# Patient Record
Sex: Female | Born: 1950
Health system: Southern US, Community
[De-identification: ages and names within clinical notes are randomized; demographics above are authoritative.]

## PROBLEM LIST (undated history)

## (undated) DIAGNOSIS — F329 Major depressive disorder, single episode, unspecified: Secondary | ICD-10-CM

## (undated) DIAGNOSIS — K219 Gastro-esophageal reflux disease without esophagitis: Secondary | ICD-10-CM

## (undated) DIAGNOSIS — J45909 Unspecified asthma, uncomplicated: Secondary | ICD-10-CM

## (undated) DIAGNOSIS — F32A Depression, unspecified: Secondary | ICD-10-CM

## (undated) DIAGNOSIS — T7840XA Allergy, unspecified, initial encounter: Secondary | ICD-10-CM

## (undated) HISTORY — DX: Gastro-esophageal reflux disease without esophagitis: K21.9

## (undated) HISTORY — DX: Major depressive disorder, single episode, unspecified: F32.9

## (undated) HISTORY — DX: Depression, unspecified: F32.A

## (undated) HISTORY — DX: Allergy, unspecified, initial encounter: T78.40XA

## (undated) HISTORY — DX: Unspecified asthma, uncomplicated: J45.909

---

## 2002-01-11 HISTORY — PX: NASAL SINUS SURGERY: SHX719

## 2008-08-01 DIAGNOSIS — J309 Allergic rhinitis, unspecified: Secondary | ICD-10-CM | POA: Insufficient documentation

## 2008-08-01 DIAGNOSIS — L219 Seborrheic dermatitis, unspecified: Secondary | ICD-10-CM | POA: Insufficient documentation

## 2009-10-20 ENCOUNTER — Ambulatory Visit: Payer: Self-pay | Admitting: Internal Medicine

## 2010-02-10 NOTE — Assessment & Plan Note (Signed)
Summary: FLU SHOT/JBB  Nurse Visit   Immunizations Administered:  Influenza Vaccine:    Vaccine Type: FLULAVAL    Site: right deltoid    Mfr: GlaxoSmithKline    Dose: 0.5 ml    Route: IM    Given by: Levonne Spiller EMT-P    Exp. Date: 06/11/2010    Lot #: [id number]    VIS given: 08/05/09 version given October 20, 2009.  Orders Added: 1)  Flulaval Injection 3 yrs >IM [Q2036]   Immunizations Administered:  Influenza Vaccine:    Vaccine Type: FLULAVAL    Site: right deltoid    Mfr: GlaxoSmithKline    Dose: 0.5 ml    Route: IM    Given by: Levonne Spiller EMT-P    Exp. Date: 06/11/2010    Lot #: [id number]    VIS given: 08/05/09 version given October 20, 2009.  Flu Vaccine Consent Questions:    Do you have a history of severe allergic reactions to this vaccine? no    Any prior history of allergic reactions to egg and/or gelatin? no    Do you have a sensitivity to the preservative Thimersol? no    Do you have a past history of Guillan-Barre Syndrome? no    Do you currently have an acute febrile illness? no    Have you ever had a severe reaction to latex? no    Vaccine information given and explained to patient? yes    Are you currently pregnant? no

## 2011-06-15 ENCOUNTER — Other Ambulatory Visit (HOSPITAL_COMMUNITY): Payer: Self-pay | Admitting: Family Medicine

## 2011-06-15 DIAGNOSIS — Z1231 Encounter for screening mammogram for malignant neoplasm of breast: Secondary | ICD-10-CM

## 2011-07-08 ENCOUNTER — Ambulatory Visit (HOSPITAL_COMMUNITY)
Admission: RE | Admit: 2011-07-08 | Discharge: 2011-07-08 | Disposition: A | Discharge status: Home / Self Care | Payer: Self-pay | Source: Ambulatory Visit | Attending: Family Medicine | Admitting: Family Medicine

## 2011-07-08 DIAGNOSIS — Z1231 Encounter for screening mammogram for malignant neoplasm of breast: Secondary | ICD-10-CM

## 2012-06-06 DIAGNOSIS — L570 Actinic keratosis: Secondary | ICD-10-CM

## 2012-06-06 HISTORY — DX: Actinic keratosis: L57.0

## 2012-11-06 LAB — HEPATIC FUNCTION PANEL
ALT: 17 U/L (ref 7–35)
AST: 17 U/L (ref 13–35)

## 2012-11-06 LAB — BASIC METABOLIC PANEL
BUN: 10 mg/dL (ref 4–21)
Creatinine: 0.8 mg/dL (ref 0.5–1.1)
GLUCOSE: 94 mg/dL
Potassium: 4.7 mmol/L (ref 3.4–5.3)
Sodium: 144 mmol/L (ref 137–147)

## 2012-11-06 LAB — CBC AND DIFFERENTIAL
HEMATOCRIT: 41 % (ref 36–46)
HEMOGLOBIN: 14 g/dL (ref 12.0–16.0)
PLATELETS: 180 10*3/uL (ref 150–399)
WBC: 4.1 10*3/mL

## 2012-11-06 LAB — LIPID PANEL
CHOLESTEROL: 225 mg/dL — AB (ref 0–200)
HDL: 62 mg/dL (ref 35–70)
LDL CALC: 132 mg/dL
Triglycerides: 157 mg/dL (ref 40–160)

## 2012-11-06 LAB — TSH: TSH: 0.95 u[IU]/mL (ref 0.41–5.90)

## 2012-11-07 ENCOUNTER — Ambulatory Visit: Payer: Self-pay | Admitting: Family Medicine

## 2012-11-08 ENCOUNTER — Ambulatory Visit: Payer: Self-pay | Admitting: Family Medicine

## 2012-12-13 ENCOUNTER — Ambulatory Visit: Payer: Self-pay | Admitting: Family Medicine

## 2013-06-07 LAB — HM COLONOSCOPY

## 2014-02-26 IMAGING — CR DG CHEST 2V
1 series · 2 of 2 positions shown · non-contrast
Comparison: None

CLINICAL DATA: Productive cough for 1 week, coughing up white mucus

EXAM:
CHEST  2 VIEW

[Series 1: pa · 0.17mm/px · 2 of 2 slices shown]
[im 1/2]
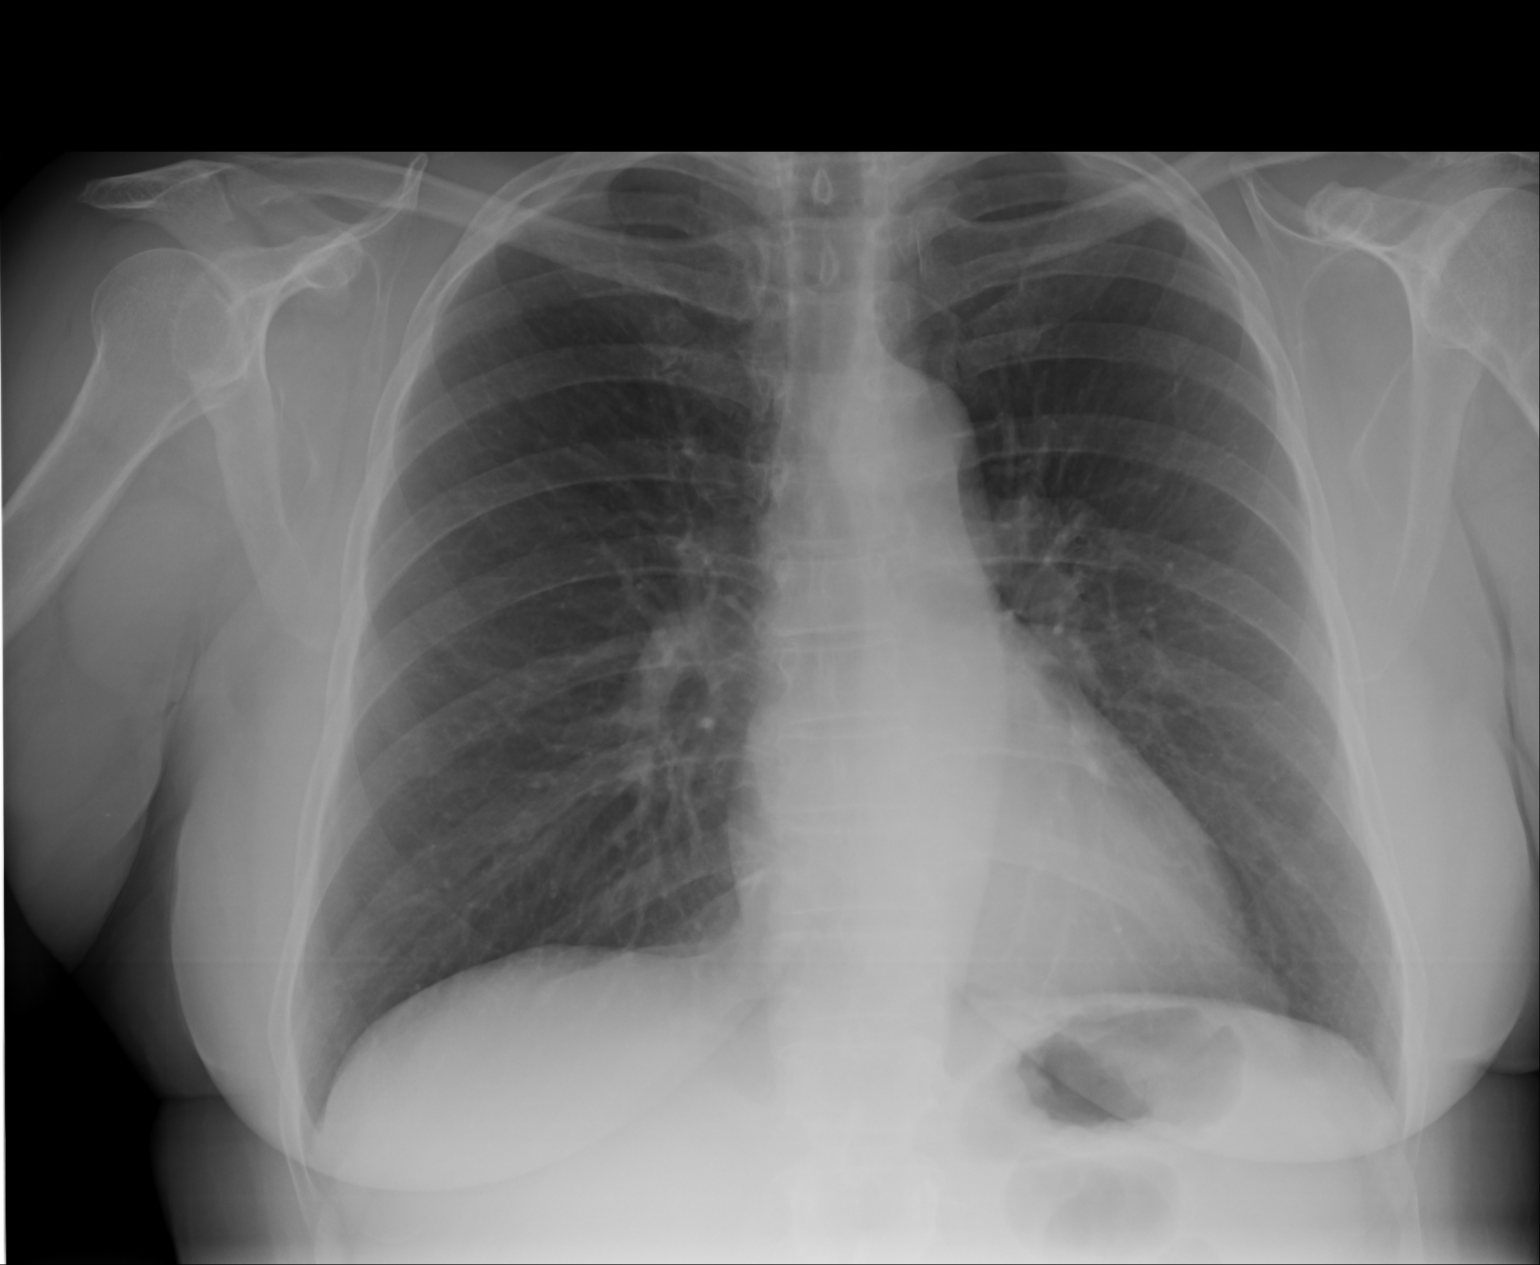
[im 2/2]
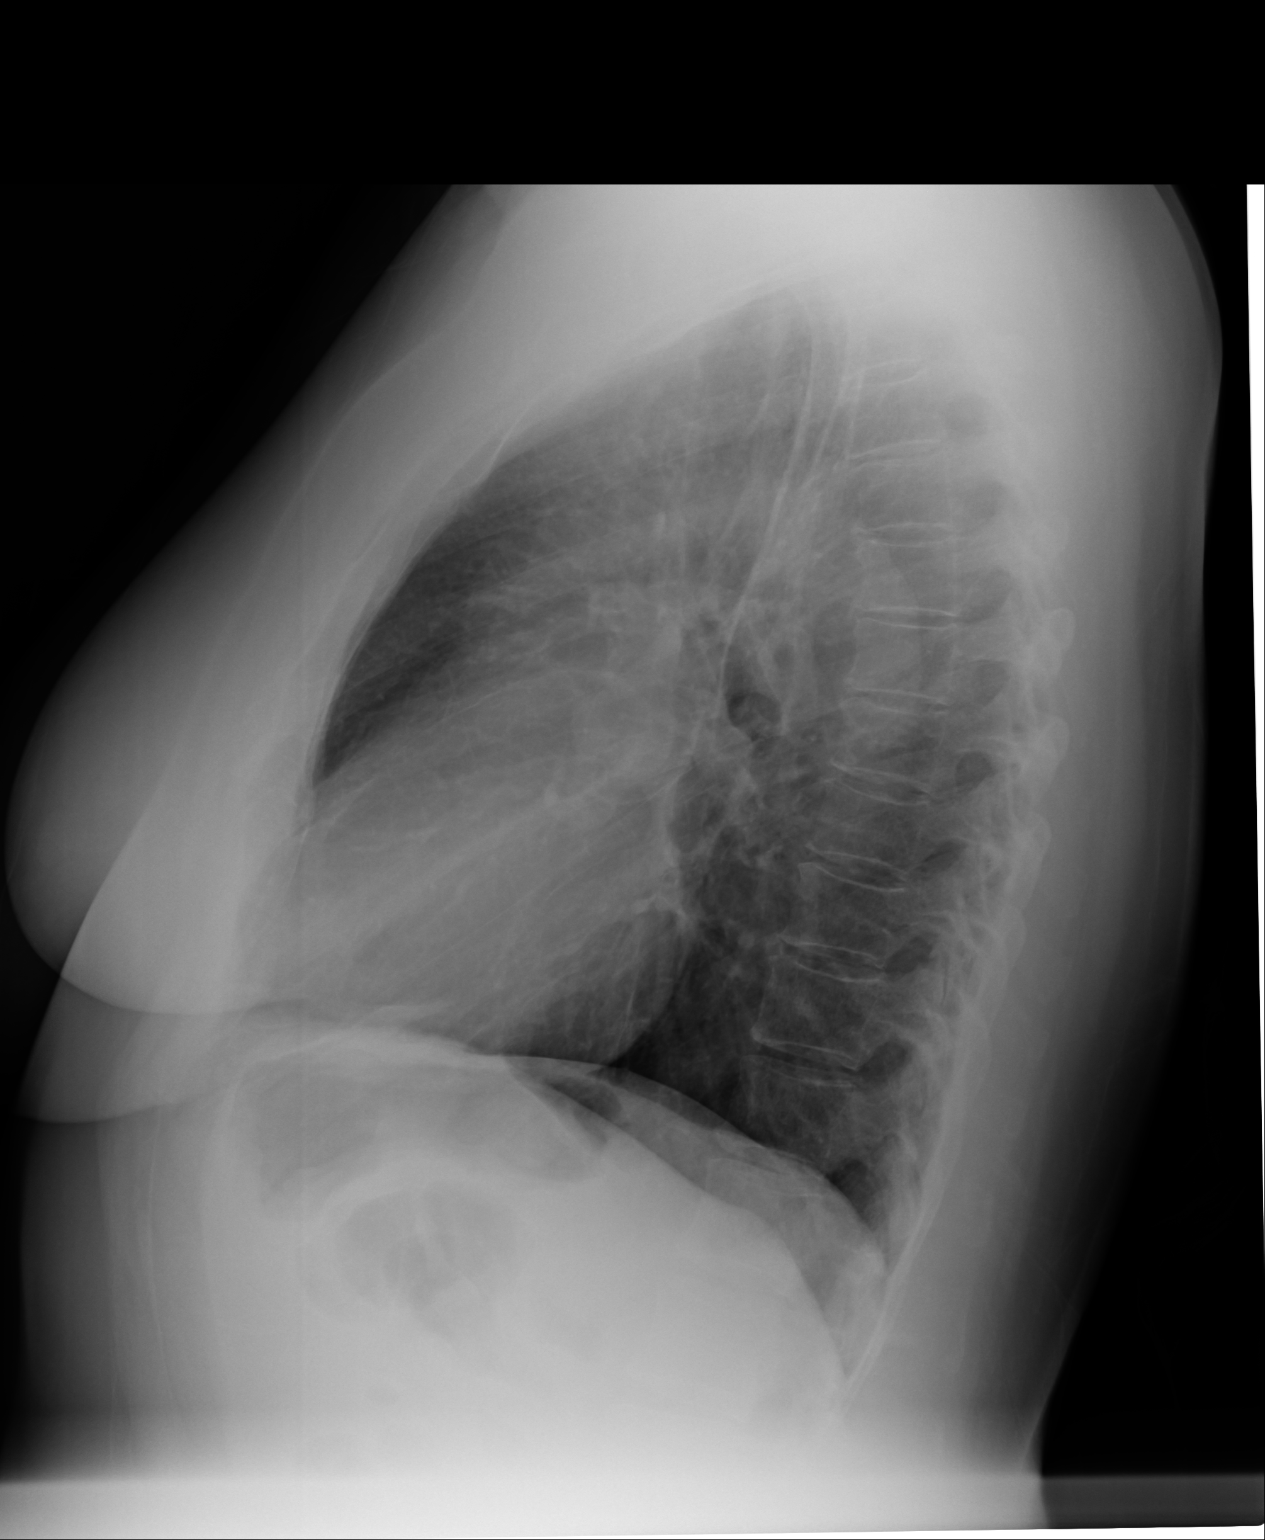

[2 of 2 positions shown; findings below may reference images not displayed]

FINDINGS: Upper normal heart size.

Normal mediastinal contours and pulmonary vascularity.

Lungs clear.

No pleural effusion or pneumothorax.

Bones demineralized.
IMPRESSION: No acute abnormalities.

## 2014-08-22 ENCOUNTER — Other Ambulatory Visit: Payer: Self-pay | Admitting: Family Medicine

## 2014-08-22 ENCOUNTER — Ambulatory Visit
Admission: RE | Admit: 2014-08-22 | Discharge: 2014-08-22 | Disposition: A | Discharge status: Home / Self Care | Payer: BLUE CROSS/BLUE SHIELD | Source: Ambulatory Visit | Attending: Family Medicine | Admitting: Family Medicine

## 2014-08-22 DIAGNOSIS — Z1231 Encounter for screening mammogram for malignant neoplasm of breast: Secondary | ICD-10-CM

## 2014-10-10 ENCOUNTER — Ambulatory Visit (INDEPENDENT_AMBULATORY_CARE_PROVIDER_SITE_OTHER): Payer: BLUE CROSS/BLUE SHIELD | Admitting: Family Medicine

## 2014-10-10 DIAGNOSIS — Z23 Encounter for immunization: Secondary | ICD-10-CM | POA: Diagnosis not present

## 2014-10-10 NOTE — Progress Notes (Signed)
Patient came in office today accompanied with her husband to receive her annual flu shot.Patient reports she has no questions or concerns today. Immunization screening was filled out and vaccine was administered. Patient inquired about shingles vaccine, I gave patient information in regards to vaccine and encouraged her to contact her insurance company to see if they will cover for vaccine.

## 2014-10-29 DIAGNOSIS — E559 Vitamin D deficiency, unspecified: Secondary | ICD-10-CM | POA: Insufficient documentation

## 2014-10-29 DIAGNOSIS — IMO0002 Reserved for concepts with insufficient information to code with codable children: Secondary | ICD-10-CM | POA: Insufficient documentation

## 2014-10-29 DIAGNOSIS — J45901 Unspecified asthma with (acute) exacerbation: Secondary | ICD-10-CM | POA: Insufficient documentation

## 2014-10-29 DIAGNOSIS — J392 Other diseases of pharynx: Secondary | ICD-10-CM | POA: Insufficient documentation

## 2014-10-29 DIAGNOSIS — F329 Major depressive disorder, single episode, unspecified: Secondary | ICD-10-CM | POA: Insufficient documentation

## 2014-10-29 DIAGNOSIS — F32A Depression, unspecified: Secondary | ICD-10-CM | POA: Insufficient documentation

## 2014-10-29 DIAGNOSIS — D219 Benign neoplasm of connective and other soft tissue, unspecified: Secondary | ICD-10-CM | POA: Insufficient documentation

## 2014-10-31 ENCOUNTER — Ambulatory Visit (INDEPENDENT_AMBULATORY_CARE_PROVIDER_SITE_OTHER): Payer: BLUE CROSS/BLUE SHIELD | Admitting: Physician Assistant

## 2014-10-31 ENCOUNTER — Encounter: Payer: Self-pay | Admitting: Physician Assistant

## 2014-10-31 VITALS — BP 130/88 | HR 78 | Temp 98.3°F | Resp 16 | Ht 63.0 in | Wt 165.8 lb

## 2014-10-31 DIAGNOSIS — R42 Dizziness and giddiness: Secondary | ICD-10-CM

## 2014-10-31 DIAGNOSIS — Z91048 Other nonmedicinal substance allergy status: Secondary | ICD-10-CM | POA: Diagnosis not present

## 2014-10-31 DIAGNOSIS — Z9109 Other allergy status, other than to drugs and biological substances: Secondary | ICD-10-CM

## 2014-10-31 DIAGNOSIS — Z124 Encounter for screening for malignant neoplasm of cervix: Secondary | ICD-10-CM | POA: Diagnosis not present

## 2014-10-31 DIAGNOSIS — Z0000 Encounter for general adult medical examination without abnormal findings: Secondary | ICD-10-CM

## 2014-10-31 MED ORDER — MONTELUKAST SODIUM 10 MG PO TABS: 10.0000 mg | ORAL_TABLET | Freq: Every day | ORAL | Status: DC

## 2014-10-31 MED ORDER — MECLIZINE HCL 25 MG PO TABS: 25.0000 mg | ORAL_TABLET | Freq: Three times a day (TID) | ORAL | Status: DC | PRN

## 2014-10-31 NOTE — Patient Instructions (Signed)

## 2014-10-31 NOTE — Progress Notes (Signed)
Patient: Katie Bass, Female    DOB: 10-Mar-1950, 64 y.o.   MRN: [id number] Visit Date: 10/31/2014  Today's Provider: Mar Daring, PA-C   Chief Complaint  Patient presents with  . Annual Exam  . Medication Refill   Subjective:    Annual physical exam Katie Bass is a 64 y.o. female who presents today for health maintenance and complete physical. She feels fairly well. She reports not exercising. She reports she is sleeping fairly well. Patient has history of vertigo. Patient takes meclizine needs a refill if there is no other medicine besides this one. Per patient Meclizine makes very tire and sleepy. Patient states the Allegra-D makes her jittery and can't go to sleep. There is no personal or family history of cervical or ovarian cancer. There is no family history of breast cancer. She states she did have a mammogram this year in September and it was normal. She also had her colonoscopy in 2015 with Dr. Vira Agar.  She is to repeat in 5 years. There is no family history of colon cancer.    Last PCP: 10/26/2012 Mammogram: 11/08/12 BI-RADS category 1: Negative Pap smear: 10/31/12 Normal. HPV Negative Colonoscopy: 06/07/2013 Dr. Tiffany Kocher, Internal Hemorrhoids. Two diminutive polyps at the recto-sigmoid colon. Diverticulosis in the sigmoid colon, in the descending colon, in the transverse colon and in the ascending colon   Review of Systems  Constitutional: Negative.   HENT: Positive for congestion, ear pain and sinus pressure.   Eyes: Negative.   Respiratory: Negative.   Cardiovascular: Negative.   Gastrointestinal: Negative.   Endocrine: Negative.   Genitourinary: Negative.   Musculoskeletal: Negative.   Skin: Negative.   Allergic/Immunologic: Negative.   Neurological: Positive for dizziness.  Hematological: Negative.   Psychiatric/Behavioral: Negative.     Social History She  reports that she has quit smoking. She does not have any smokeless tobacco history on  file. She reports that she drinks alcohol. She reports that she does not use illicit drugs. Social History   Social History  . Marital Status: Married    Spouse Name: N/A  . Number of Children: N/A  . Years of Education: N/A   Social History Main Topics  . Smoking status: Former Research scientist (life sciences)  . Smokeless tobacco: None     Comment: smoked <1pack if cigarettesper day, for 1 year in her 16s  . Alcohol Use: Yes     Comment: 1 cocktail a month  . Drug Use: No  . Sexual Activity: Not Asked   Other Topics Concern  . None   Social History Narrative    Patient Active Problem List   Diagnosis Date Noted  . Extrinsic asthma with exacerbation 10/29/2014  . Clinical depression 10/29/2014  . Disease of pharynx 10/29/2014  . Positional vertigo 10/29/2014  . Fibroid 10/29/2014  . Avitaminosis D 10/29/2014  . Allergic rhinitis 08/01/2008  . Dermatitis seborrheica 08/01/2008    Past Surgical History  Procedure Laterality Date  . Nasal sinus surgery  2004    Family History  Family Status  Relation Status Death Age  . Mother Deceased     cause of death was ovarian cancer and uterine cancer  . Father Deceased     cause of death was stroke  . Brother Alive   . Daughter Alive   . Son Alive    Her family history includes Cancer in her mother.    Allergies  Allergen Reactions  . Sulfa Antibiotics Itching and Swelling  Previous Medications   ERGOCALCIFEROL (VITAMIN D2) 2000 UNITS TABS    Take 1 tablet by mouth daily.   FEXOFENADINE-PSEUDOEPHEDRINE (ALLEGRA-D 24 HOUR PO)    Take 1 tablet by mouth daily.   FLUTICASONE (FLONASE) 50 MCG/ACT NASAL SPRAY    Place into the nose.   MECLIZINE (ANTIVERT) 25 MG TABLET    Take by mouth.   MONTELUKAST (SINGULAIR) 10 MG TABLET    Take by mouth.   MULTIPLE VITAMIN TABLET       OMEGA-3 FATTY ACIDS (FISH OIL) 1000 MG CAPS    Take by mouth.    Patient Care Team: Mar Daring, PA-C as PCP - General (Physician Assistant)     Objective:     Vitals: BP 130/88 mmHg  Pulse 78  Temp(Src) 98.3 F (36.8 C) (Oral)  Resp 16  Ht 5\' 3"  (1.6 m)  Wt 165 lb 12.8 oz (75.206 kg)  BMI 29.38 kg/m2   Physical Exam  Constitutional: She is oriented to person, place, and time. She appears well-developed and well-nourished. No distress.  HENT:  Head: Normocephalic and atraumatic.  Right Ear: Hearing, external ear and ear canal normal. A middle ear effusion is present.  Left Ear: Hearing, external ear and ear canal normal. A middle ear effusion is present.  Nose: Right sinus exhibits maxillary sinus tenderness and frontal sinus tenderness. Left sinus exhibits maxillary sinus tenderness and frontal sinus tenderness.  Mouth/Throat: Uvula is midline, oropharynx is clear and moist and mucous membranes are normal. She has dentures. No oropharyngeal exudate.  Eyes: Conjunctivae and EOM are normal. Pupils are equal, round, and reactive to light. Right eye exhibits no discharge. Left eye exhibits no discharge. No scleral icterus.  Neck: Normal range of motion. Neck supple. No JVD present. Carotid bruit is not present. No tracheal deviation present. No thyromegaly present.  Cardiovascular: Normal rate, regular rhythm, normal heart sounds and intact distal pulses.  Exam reveals no gallop and no friction rub.   No murmur heard. Pulmonary/Chest: Effort normal and breath sounds normal. No respiratory distress. She has no wheezes. She has no rales. She exhibits no tenderness. Right breast exhibits no inverted nipple, no mass, no nipple discharge, no skin change and no tenderness. Left breast exhibits no inverted nipple, no mass, no nipple discharge, no skin change and no tenderness. Breasts are symmetrical.  Abdominal: Soft. Bowel sounds are normal. She exhibits no distension and no mass. There is no tenderness. There is no rebound and no guarding. Hernia confirmed negative in the right inguinal area and confirmed negative in the left inguinal area.   Genitourinary: Vagina normal and uterus normal. Rectal exam shows internal hemorrhoid. Rectal exam shows no external hemorrhoid, no fissure, no mass, no tenderness and anal tone normal. Guaiac negative stool. No breast swelling, tenderness, discharge or bleeding. Pelvic exam was performed with patient supine. There is no rash, tenderness, lesion or injury on the right labia. There is no rash, tenderness, lesion or injury on the left labia. Cervix exhibits discharge (clear mucous-like discharge). Cervix exhibits no motion tenderness and no friability. Right adnexum displays no mass, no tenderness and no fullness. Left adnexum displays no mass, no tenderness and no fullness. No erythema, tenderness or bleeding in the vagina. No signs of injury around the vagina. No vaginal discharge found.  Musculoskeletal: Normal range of motion. She exhibits no edema or tenderness.  Lymphadenopathy:    She has no cervical adenopathy.       Right: No inguinal adenopathy present.  Left: No inguinal adenopathy present.  Neurological: She is alert and oriented to person, place, and time. She has normal reflexes. No cranial nerve deficit. Coordination normal.  Skin: Skin is warm and dry. No rash noted. She is not diaphoretic.  Psychiatric: She has a normal mood and affect. Her behavior is normal. Judgment and thought content normal.  Vitals reviewed.    Depression Screen No flowsheet data found.    Assessment & Plan:     Routine Health Maintenance and Physical Exam  1. Annual physical exam She did not want labs done. Physical exam was overall normal. I will see her back in one year for repeat physical exam or any time sooner if she has any acute issue.  2. Vertigo She does have vertigo for which she is able to control with meclizine. Meclizine was refilled as below. She does state that her vertigo is worse when her allergies are bothering her or if she feels congested. She may call the office if her  vertigo worsens or does not respond to meclizine.  - meclizine (ANTIVERT) 25 MG tablet; Take 1 tablet (25 mg total) by mouth 3 (three) times daily as needed for dizziness.  Dispense: 30 tablet; Refill: 11  3. Environmental allergies She is currently taking Allegra-D over-the-counter and using her Singulair and Flonase for allergy relief. The Allegra-D 24 hour did make her feel little jittery. She stated that she had taken a 12 hour before of the same medication and done better. She is planning to buy the 12 hour and try that again. Singulair was refilled as below. She is to call the office if symptoms fail to improve or worsen.  - montelukast (SINGULAIR) 10 MG tablet; Take 1 tablet (10 mg total) by mouth at bedtime.  Dispense: 30 tablet; Refill: 11  4. Cervical cancer screening  Pelvic exam was normal today. Pap was collected. I will follow-up with her pending Pap results. If Pap is normal I will see her back in one year with repeat physical exam.  - Pap IG w/ reflex to HPV when ASC-U   Exercise Activities and Dietary recommendations Goals    None      Immunization History  Administered Date(s) Administered  . Influenza Whole 10/20/2009  . Influenza,inj,Quad PF,36+ Mos 10/10/2014    Health Maintenance  Topic Date Due  . Hepatitis C Screening  Mar 23, 1950  . HIV Screening  09/14/1966  . TETANUS/TDAP  09/14/1970  . PAP SMEAR  09/13/1972  . ZOSTAVAX  09/14/2011  . INFLUENZA VACCINE  08/12/2015  . MAMMOGRAM  08/21/2016  . COLONOSCOPY  06/08/2023      Discussed health benefits of physical activity, and encouraged her to engage in regular exercise appropriate for her age and condition.    --------------------------------------------------------------------

## 2014-11-01 ENCOUNTER — Other Ambulatory Visit: Payer: Self-pay

## 2014-11-01 ENCOUNTER — Encounter: Payer: Self-pay | Admitting: Physician Assistant

## 2014-11-01 DIAGNOSIS — Z9109 Other allergy status, other than to drugs and biological substances: Secondary | ICD-10-CM

## 2014-11-01 MED ORDER — FLUTICASONE PROPIONATE 50 MCG/ACT NA SUSP: 2.0000 | Freq: Every day | NASAL | Status: DC

## 2014-11-01 NOTE — Telephone Encounter (Signed)
Patient wants this to the [employer].  Thanks.

## 2014-11-06 ENCOUNTER — Telehealth: Payer: Self-pay

## 2014-11-06 LAB — PAP IG W/ RFLX HPV ASCU: PAP SMEAR COMMENT: 0

## 2014-11-06 NOTE — Telephone Encounter (Signed)
-----   Message from Mar Daring, [place] sent at 11/06/2014 12:01 PM EDT ----- Normal pap.

## 2014-11-06 NOTE — Telephone Encounter (Signed)
Patient has been advised. KW 

## 2015-03-21 ENCOUNTER — Ambulatory Visit (INDEPENDENT_AMBULATORY_CARE_PROVIDER_SITE_OTHER): Payer: BLUE CROSS/BLUE SHIELD | Admitting: Physician Assistant

## 2015-03-21 ENCOUNTER — Encounter: Payer: Self-pay | Admitting: Physician Assistant

## 2015-03-21 VITALS — BP 140/88 | HR 74 | Temp 98.1°F | Resp 16 | Wt 168.2 lb

## 2015-03-21 DIAGNOSIS — J0141 Acute recurrent pansinusitis: Secondary | ICD-10-CM

## 2015-03-21 MED ORDER — AMOXICILLIN-POT CLAVULANATE 875-125 MG PO TABS: 1.0000 | ORAL_TABLET | Freq: Two times a day (BID) | ORAL | Status: DC

## 2015-03-21 NOTE — Progress Notes (Signed)
Patient: Katie Bass Female    DOB: 04-07-1950   65 y.o.   MRN: [id number] Visit Date: 03/21/2015  Today's Provider: Mar Daring, PA-C   Chief Complaint  Patient presents with  . Sinusitis   Subjective:    Sinusitis This is a new problem. The problem has been gradually worsening since onset. There has been no fever. Associated symptoms include congestion, coughing, ear pain (Left ear pain started yesterday), headaches, a hoarse voice, sinus pressure, sneezing and a sore throat (started since this week and she is clearing her throat a lot). Pertinent negatives include no chills, shortness of breath or swollen glands. Past treatments include oral decongestants and spray decongestants (Mucinex D, Mucinex 12 hr, Allegra, Flonase). The treatment provided no relief.  She does have chronic sinusitis and states she gets infections each season change. She has been seen by ENT and has had a sinuplasty done in 2006.     Allergies  Allergen Reactions  . Sulfa Antibiotics Itching and Swelling   Previous Medications   ERGOCALCIFEROL (VITAMIN D2) 2000 UNITS TABS    Take 1 tablet by mouth daily.   FEXOFENADINE-PSEUDOEPHEDRINE (ALLEGRA-D 24 HOUR PO)    Take 1 tablet by mouth daily.   FLUTICASONE (FLONASE) 50 MCG/ACT NASAL SPRAY    Place 2 sprays into both nostrils daily.   MECLIZINE (ANTIVERT) 25 MG TABLET    Take 1 tablet (25 mg total) by mouth 3 (three) times daily as needed for dizziness.   MONTELUKAST (SINGULAIR) 10 MG TABLET    Take 1 tablet (10 mg total) by mouth at bedtime.   MULTIPLE VITAMIN TABLET       OMEGA-3 FATTY ACIDS (FISH OIL) 1000 MG CAPS    Take by mouth.    Review of Systems  Constitutional: Negative for fever and chills.  HENT: Positive for congestion, ear pain (Left ear pain started yesterday), hoarse voice, postnasal drip, sinus pressure, sneezing, sore throat (started since this week and she is clearing her throat a lot) and voice change. Negative for  rhinorrhea.   Respiratory: Positive for cough. Negative for chest tightness, shortness of breath and wheezing.   Cardiovascular: Negative for chest pain, palpitations and leg swelling.  Gastrointestinal: Negative for vomiting, abdominal pain and diarrhea.  Neurological: Positive for headaches. Negative for dizziness.    Social History  Substance Use Topics  . Smoking status: Former Research scientist (life sciences)  . Smokeless tobacco: Not on file     Comment: smoked <1pack if cigarettesper day, for 1 year in her 66s  . Alcohol Use: Yes     Comment: 1 cocktail a month   Objective:   BP 140/88 mmHg  Pulse 74  Temp(Src) 98.1 F (36.7 C) (Oral)  Resp 16  Wt 168 lb 3.2 oz (76.295 kg)  SpO2 97%  Physical Exam  Constitutional: She appears well-developed and well-nourished. No distress.  HENT:  Head: Normocephalic and atraumatic.  Right Ear: Hearing, external ear and ear canal normal. Tympanic membrane is not erythematous and not bulging. A middle ear effusion is present.  Left Ear: Hearing, external ear and ear canal normal. Tympanic membrane is not erythematous and not bulging. A middle ear effusion is present.  Nose: Mucosal edema and rhinorrhea present. Right sinus exhibits maxillary sinus tenderness and frontal sinus tenderness. Left sinus exhibits maxillary sinus tenderness and frontal sinus tenderness.  Mouth/Throat: Uvula is midline, oropharynx is clear and moist and mucous membranes are normal. No oropharyngeal exudate, posterior oropharyngeal edema or posterior  oropharyngeal erythema.  Neck: Normal range of motion. Neck supple. No tracheal deviation present. No thyromegaly present.  Cardiovascular: Normal rate, regular rhythm and normal heart sounds.  Exam reveals no gallop and no friction rub.   No murmur heard. Pulmonary/Chest: Effort normal and breath sounds normal. No stridor. No respiratory distress. She has no wheezes. She has no rales.  Lymphadenopathy:    She has no cervical adenopathy.  Skin:  She is not diaphoretic.  Vitals reviewed.       Assessment & Plan:     1. Acute recurrent pansinusitis Worsening symptoms that have not responded to over-the-counter medications. I will give Augmentin as below. She is to continue her Allegra and Flonase for seasonal allergy symptoms. She may continue Mucinex DM for congestion. She may take Tylenol as needed for fevers and body aches. She needs to stay well-hydrated and get plenty of rest. She is to call the office if symptoms fail to improve or worsen. - amoxicillin-clavulanate (AUGMENTIN) 875-125 MG tablet; Take 1 tablet by mouth 2 (two) times daily.  Dispense: 20 tablet; Refill: 0       Mar Daring, PA-C  [facility]

## 2015-03-21 NOTE — Patient Instructions (Signed)

## 2015-05-23 ENCOUNTER — Encounter: Payer: Self-pay | Admitting: Physician Assistant

## 2015-05-23 ENCOUNTER — Telehealth: Payer: Self-pay | Admitting: *Deleted

## 2015-05-23 ENCOUNTER — Ambulatory Visit (INDEPENDENT_AMBULATORY_CARE_PROVIDER_SITE_OTHER): Payer: BLUE CROSS/BLUE SHIELD | Admitting: Physician Assistant

## 2015-05-23 VITALS — BP 160/90 | HR 78 | Temp 98.2°F | Resp 16 | Wt 165.6 lb

## 2015-05-23 DIAGNOSIS — W57XXXA Bitten or stung by nonvenomous insect and other nonvenomous arthropods, initial encounter: Secondary | ICD-10-CM | POA: Diagnosis not present

## 2015-05-23 DIAGNOSIS — T148 Other injury of unspecified body region: Secondary | ICD-10-CM | POA: Diagnosis not present

## 2015-05-23 MED ORDER — DOXYCYCLINE HYCLATE 100 MG PO TABS: 100.0000 mg | ORAL_TABLET | Freq: Two times a day (BID) | ORAL | Status: DC

## 2015-05-23 NOTE — Telephone Encounter (Signed)
Patient called office to schedule an apt. Patient found a tick on her side yesterday morning. Patient removed tick, but she says there is a knot and some redness where tick was stuck. Patient has appt today at 1:45.

## 2015-05-23 NOTE — Progress Notes (Signed)
Patient: Katie Bass Female    DOB: 02-06-1950   65 y.o.   MRN: [id number] Visit Date: 05/23/2015  Today's Provider: Mar Daring, PA-C   Chief Complaint  Patient presents with  . Rash   Subjective:    HPI  Katie Bass is here today c/o a rash. She pulled a tick off yesterday morning from the upper left side. She was itching and she felt like a little bump and when she pulled it off notice it was a tick. She notice the rash yesterday after she pulled it off. She being putting alcohol and neosporin on the area. She denies any fevers, chills, nausea, vomiting, rash in other areas, or myalgias.     Allergies  Allergen Reactions  . Sulfa Antibiotics Itching and Swelling   Previous Medications   AMOXICILLIN-CLAVULANATE (AUGMENTIN) 875-125 MG TABLET    Take 1 tablet by mouth 2 (two) times daily.   ERGOCALCIFEROL (VITAMIN D2) 2000 UNITS TABS    Take 1 tablet by mouth daily.   FEXOFENADINE-PSEUDOEPHEDRINE (ALLEGRA-D 24 HOUR PO)    Take 1 tablet by mouth daily.   FLUTICASONE (FLONASE) 50 MCG/ACT NASAL SPRAY    Place 2 sprays into both nostrils daily.   MECLIZINE (ANTIVERT) 25 MG TABLET    Take 1 tablet (25 mg total) by mouth 3 (three) times daily as needed for dizziness.   MONTELUKAST (SINGULAIR) 10 MG TABLET    Take 1 tablet (10 mg total) by mouth at bedtime.   MULTIPLE VITAMIN TABLET       OMEGA-3 FATTY ACIDS (FISH OIL) 1000 MG CAPS    Take by mouth.    Review of Systems  Constitutional: Negative for fever and chills.  HENT: Negative for congestion, postnasal drip, rhinorrhea, sore throat, tinnitus and trouble swallowing.   Respiratory: Negative for cough, chest tightness and shortness of breath.   Cardiovascular: Negative for chest pain.  Gastrointestinal: Negative for nausea, vomiting and abdominal pain.  Skin: Positive for rash.    Social History  Substance Use Topics  . Smoking status: Former Research scientist (life sciences)  . Smokeless tobacco: Not on file     Comment: smoked  <1pack if cigarettesper day, for 1 year in her 48s  . Alcohol Use: Yes     Comment: 1 cocktail a month   Objective:   BP 160/90 mmHg  Pulse 78  Temp(Src) 98.2 F (36.8 C) (Oral)  Resp 16  Wt 165 lb 9.6 oz (75.116 kg)  Physical Exam  Constitutional: She appears well-developed and well-nourished. No distress.  Neck: Normal range of motion. Neck supple.  Cardiovascular: Normal rate, regular rhythm and normal heart sounds.  Exam reveals no gallop and no friction rub.   No murmur heard. Pulmonary/Chest: Effort normal and breath sounds normal. No respiratory distress. She has no wheezes. She has no rales.  Lymphadenopathy:    She has no cervical adenopathy.  Skin: She is not diaphoretic.     Vitals reviewed.       Assessment & Plan:     1. Tick bite Due to warmth and induration will treat with doxycycline as below. Advised to avoid direct sun while on medication. Take with food. Call if rash worsens, develops myalgias or if fevers develop. - doxycycline (VIBRA-TABS) 100 MG tablet; Take 1 tablet (100 mg total) by mouth 2 (two) times daily.  Dispense: 14 tablet; Refill: 0       Mar Daring, PA-C  [facility]

## 2015-05-23 NOTE — Patient Instructions (Signed)
Tick Bite Information Ticks are insects that attach themselves to the skin and draw blood for food. There are various types of ticks. Common types include wood ticks and deer ticks. Most ticks live in shrubs and grassy areas. Ticks can climb onto your body when you make contact with leaves or grass where the tick is waiting. The most common places on the body for ticks to attach themselves are the scalp, neck, armpits, waist, and groin. Most tick bites are harmless, but sometimes ticks carry germs that cause diseases. These germs can be spread to a person during the tick's feeding process. The chance of a disease spreading through a tick bite depends on:   The type of tick.  Time of year.   How long the tick is attached.   Geographic location.  HOW CAN YOU PREVENT TICK BITES? Take these steps to help prevent tick bites when you are outdoors:  Wear protective clothing. Long sleeves and long pants are best.   Wear white clothes so you can see ticks more easily.  Tuck your pant legs into your socks.   If walking on a trail, stay in the middle of the trail to avoid brushing against bushes.  Avoid walking through areas with long grass.  Put insect repellent on all exposed skin and along boot tops, pant legs, and sleeve cuffs.   Check clothing, hair, and skin repeatedly and before going inside.   Brush off any ticks that are not attached.  Take a shower or bath as soon as possible after being outdoors.  WHAT IS THE PROPER WAY TO REMOVE A TICK? Ticks should be removed as soon as possible to help prevent diseases caused by tick bites. 1. If latex gloves are available, put them on before trying to remove a tick.  2. Using fine-point tweezers, grasp the tick as close to the skin as possible. You may also use curved forceps or a tick removal tool. Grasp the tick as close to its head as possible. Avoid grasping the tick on its body. 3. Pull gently with steady upward pressure until  the tick lets go. Do not twist the tick or jerk it suddenly. This may break off the tick's head or mouth parts. 4. Do not squeeze or crush the tick's body. This could force disease-carrying fluids from the tick into your body.  5. After the tick is removed, wash the bite area and your hands with soap and water or other disinfectant such as alcohol. 6. Apply a small amount of antiseptic cream or ointment to the bite site.  7. Wash and disinfect any instruments that were used.  Do not try to remove a tick by applying a hot match, petroleum jelly, or fingernail polish to the tick. These methods do not work and may increase the chances of disease being spread from the tick bite.  WHEN SHOULD YOU SEEK MEDICAL CARE? Contact your health care provider if you are unable to remove a tick from your skin or if a part of the tick breaks off and is stuck in the skin.  After a tick bite, you need to be aware of signs and symptoms that could be related to diseases spread by ticks. Contact your health care provider if you develop any of the following in the days or weeks after the tick bite:  Unexplained fever.  Rash. A circular rash that appears days or weeks after the tick bite may indicate the possibility of Lyme disease. The rash may resemble   a target with a bull's-eye and may occur at a different part of your body than the tick bite.  Redness and swelling in the area of the tick bite.   Tender, swollen lymph glands.   Diarrhea.   Weight loss.   Cough.   Fatigue.   Muscle, joint, or bone pain.   Abdominal pain.   Headache.   Lethargy or a change in your level of consciousness.  Difficulty walking or moving your legs.   Numbness in the legs.   Paralysis.  Shortness of breath.   Confusion.   Repeated vomiting.    This information is not intended to replace advice given to you by your health care provider. Make sure you discuss any questions you have with your health  care provider.   Document Released: 12/26/1999 Document Revised: 01/18/2014 Document Reviewed: 06/07/2012 Elsevier Interactive Patient Education 2016 [employer]  

## 2015-05-28 ENCOUNTER — Encounter: Payer: Self-pay | Admitting: Physician Assistant

## 2015-05-28 ENCOUNTER — Ambulatory Visit (INDEPENDENT_AMBULATORY_CARE_PROVIDER_SITE_OTHER): Payer: BLUE CROSS/BLUE SHIELD | Admitting: Physician Assistant

## 2015-05-28 VITALS — BP 140/90 | HR 69 | Temp 98.2°F | Resp 16

## 2015-05-28 DIAGNOSIS — T148 Other injury of unspecified body region: Secondary | ICD-10-CM

## 2015-05-28 DIAGNOSIS — L989 Disorder of the skin and subcutaneous tissue, unspecified: Secondary | ICD-10-CM | POA: Diagnosis not present

## 2015-05-28 DIAGNOSIS — W57XXXA Bitten or stung by nonvenomous insect and other nonvenomous arthropods, initial encounter: Secondary | ICD-10-CM | POA: Diagnosis not present

## 2015-05-28 DIAGNOSIS — R238 Other skin changes: Secondary | ICD-10-CM

## 2015-05-28 MED ORDER — NYSTATIN-TRIAMCINOLONE 100000-0.1 UNIT/GM-% EX OINT: 1.0000 "application " | TOPICAL_OINTMENT | Freq: Two times a day (BID) | CUTANEOUS | Status: DC

## 2015-05-28 NOTE — Progress Notes (Signed)
Patient: Katie Bass Female    DOB: [birth year]   65 y.o.   MRN: [id number] Visit Date: 05/28/2015  Today's Provider: Mar Daring, PA-C   Chief Complaint  Patient presents with  . Itching from the tick bite   Subjective:    HPI  Katie Bass returns today with c/o of still itching on the tick bite that she had last week. It is more red and very itchy. She reports she can't wear a bra because it bothers her. More bumps around the tick bite. She is currently on doxycycline and has 2 days left of treatment.      Allergies  Allergen Reactions  . Sulfa Antibiotics Itching and Swelling   Previous Medications   AMOXICILLIN-CLAVULANATE (AUGMENTIN) 875-125 MG TABLET    Take 1 tablet by mouth 2 (two) times daily.   DOXYCYCLINE (VIBRA-TABS) 100 MG TABLET    Take 1 tablet (100 mg total) by mouth 2 (two) times daily.   ERGOCALCIFEROL (VITAMIN D2) 2000 UNITS TABS    Take 1 tablet by mouth daily.   FEXOFENADINE-PSEUDOEPHEDRINE (ALLEGRA-D 24 HOUR PO)    Take 1 tablet by mouth daily.   FLUTICASONE (FLONASE) 50 MCG/ACT NASAL SPRAY    Place 2 sprays into both nostrils daily.   MECLIZINE (ANTIVERT) 25 MG TABLET    Take 1 tablet (25 mg total) by mouth 3 (three) times daily as needed for dizziness.   MONTELUKAST (SINGULAIR) 10 MG TABLET    Take 1 tablet (10 mg total) by mouth at bedtime.   MULTIPLE VITAMIN TABLET       OMEGA-3 FATTY ACIDS (FISH OIL) 1000 MG CAPS    Take by mouth.    Review of Systems  Constitutional: Negative for fever, chills and fatigue.  Respiratory: Negative for cough, chest tightness and shortness of breath.   Cardiovascular: Negative for chest pain and leg swelling.  Gastrointestinal: Negative for nausea, vomiting and abdominal pain.  Skin: Positive for rash.  Neurological: Negative for dizziness, weakness and headaches.    Social History  Substance Use Topics  . Smoking status: Former Research scientist (life sciences)  . Smokeless tobacco: Not on file     Comment: smoked <1pack  if cigarettesper day, for 1 year in her 7s  . Alcohol Use: Yes     Comment: 1 cocktail a month   Objective:   BP 140/90 mmHg  Pulse 69  Temp(Src) 98.2 F (36.8 C) (Oral)  Resp 16  Wt   Physical Exam  Constitutional: She appears well-developed and well-nourished. No distress.  Neck: Normal range of motion. Neck supple.  Cardiovascular: Normal rate, regular rhythm and normal heart sounds.  Exam reveals no gallop and no friction rub.   No murmur heard. Pulmonary/Chest: Effort normal and breath sounds normal. No respiratory distress. She has no wheezes. She has no rales.  Lymphadenopathy:    She has no cervical adenopathy.  Skin: Rash noted. Rash is maculopapular. She is not diaphoretic.     Vitals reviewed.       Assessment & Plan:     1. Skin irritation The rash that is now present looks like an allergic reaction vs irritation due to either the tick bite itself or the cleansers she has been using over the area. Advised to discontinue the rubbing alcohol and cream she is using and to begin using the myocolog II cream as below. She is to call the office if the rash continues to spread. - nystatin-triamcinolone ointment (MYCOLOG); Apply 1  application topically 2 (two) times daily. X 10-14 days  Dispense: 30 g; Refill: 0  2. Tick bite See above medical treatment plan - nystatin-triamcinolone ointment (MYCOLOG); Apply 1 application topically 2 (two) times daily. X 10-14 days  Dispense: 30 g; Refill: 0       Mar Daring, PA-C  [facility]

## 2015-05-28 NOTE — Patient Instructions (Signed)
Tick Bite Information Ticks are insects that attach themselves to the skin and draw blood for food. There are various types of ticks. Common types include wood ticks and deer ticks. Most ticks live in shrubs and grassy areas. Ticks can climb onto your body when you make contact with leaves or grass where the tick is waiting. The most common places on the body for ticks to attach themselves are the scalp, neck, armpits, waist, and groin. Most tick bites are harmless, but sometimes ticks carry germs that cause diseases. These germs can be spread to a person during the tick's feeding process. The chance of a disease spreading through a tick bite depends on:   The type of tick.  Time of year.   How long the tick is attached.   Geographic location.  HOW CAN YOU PREVENT TICK BITES? Take these steps to help prevent tick bites when you are outdoors:  Wear protective clothing. Long sleeves and long pants are best.   Wear white clothes so you can see ticks more easily.  Tuck your pant legs into your socks.   If walking on a trail, stay in the middle of the trail to avoid brushing against bushes.  Avoid walking through areas with long grass.  Put insect repellent on all exposed skin and along boot tops, pant legs, and sleeve cuffs.   Check clothing, hair, and skin repeatedly and before going inside.   Brush off any ticks that are not attached.  Take a shower or bath as soon as possible after being outdoors.  WHAT IS THE PROPER WAY TO REMOVE A TICK? Ticks should be removed as soon as possible to help prevent diseases caused by tick bites. 1. If latex gloves are available, put them on before trying to remove a tick.  2. Using fine-point tweezers, grasp the tick as close to the skin as possible. You may also use curved forceps or a tick removal tool. Grasp the tick as close to its head as possible. Avoid grasping the tick on its body. 3. Pull gently with steady upward pressure until  the tick lets go. Do not twist the tick or jerk it suddenly. This may break off the tick's head or mouth parts. 4. Do not squeeze or crush the tick's body. This could force disease-carrying fluids from the tick into your body.  5. After the tick is removed, wash the bite area and your hands with soap and water or other disinfectant such as alcohol. 6. Apply a small amount of antiseptic cream or ointment to the bite site.  7. Wash and disinfect any instruments that were used.  Do not try to remove a tick by applying a hot match, petroleum jelly, or fingernail polish to the tick. These methods do not work and may increase the chances of disease being spread from the tick bite.  WHEN SHOULD YOU SEEK MEDICAL CARE? Contact your health care provider if you are unable to remove a tick from your skin or if a part of the tick breaks off and is stuck in the skin.  After a tick bite, you need to be aware of signs and symptoms that could be related to diseases spread by ticks. Contact your health care provider if you develop any of the following in the days or weeks after the tick bite:  Unexplained fever.  Rash. A circular rash that appears days or weeks after the tick bite may indicate the possibility of Lyme disease. The rash may resemble   a target with a bull's-eye and may occur at a different part of your body than the tick bite.  Redness and swelling in the area of the tick bite.   Tender, swollen lymph glands.   Diarrhea.   Weight loss.   Cough.   Fatigue.   Muscle, joint, or bone pain.   Abdominal pain.   Headache.   Lethargy or a change in your level of consciousness.  Difficulty walking or moving your legs.   Numbness in the legs.   Paralysis.  Shortness of breath.   Confusion.   Repeated vomiting.    This information is not intended to replace advice given to you by your health care provider. Make sure you discuss any questions you have with your health  care provider.   Document Released: 12/26/1999 Document Revised: 01/18/2014 Document Reviewed: 06/07/2012 Elsevier Interactive Patient Education 2016 [employer]  

## 2015-06-10 ENCOUNTER — Encounter: Payer: Self-pay | Admitting: Family Medicine

## 2015-06-10 ENCOUNTER — Ambulatory Visit (INDEPENDENT_AMBULATORY_CARE_PROVIDER_SITE_OTHER): Payer: BLUE CROSS/BLUE SHIELD | Admitting: Family Medicine

## 2015-06-10 VITALS — BP 136/90 | HR 80 | Temp 98.2°F | Resp 16 | Wt 167.8 lb

## 2015-06-10 DIAGNOSIS — W57XXXA Bitten or stung by nonvenomous insect and other nonvenomous arthropods, initial encounter: Secondary | ICD-10-CM | POA: Diagnosis not present

## 2015-06-10 DIAGNOSIS — T148 Other injury of unspecified body region: Secondary | ICD-10-CM | POA: Diagnosis not present

## 2015-06-10 DIAGNOSIS — L304 Erythema intertrigo: Secondary | ICD-10-CM

## 2015-06-10 MED ORDER — DOXYCYCLINE HYCLATE 100 MG PO TABS: 100.0000 mg | ORAL_TABLET | Freq: Two times a day (BID) | ORAL | Status: DC

## 2015-06-10 MED ORDER — FLUOCINONIDE 0.05 % EX CREA: 1.0000 "application " | TOPICAL_CREAM | Freq: Three times a day (TID) | CUTANEOUS | Status: DC

## 2015-06-10 NOTE — Patient Instructions (Signed)
Discussed use of clotrimazole cream mixed in equal amounts with the steroid cream 3 x day.

## 2015-06-10 NOTE — Progress Notes (Signed)
Subjective:     Patient ID: Katie Bass, female   DOB: Jul 14, 1950, 65 y.o.   MRN: [id number]  HPI  Chief Complaint  Patient presents with  . Rash    Patient comes in office today to address rash on her left side of breast Patient reports that she was seen in office about 3 weeks ago for tick bite and was precsribed round of doxycyline. Patient states that after she finished antibiotic rash never went away around tick bite and skin was red and irritated. Patient reports that rash is spreading up torso and she has beenusing Nystatin cream thaqt was prescribed at last visit but it is not helping with rash.   States she d/c'ed the Nystatin/triamcinolone after 3 days as it did not seem like it helped. Reports rash seemed to improve the most after treatment with the doxycycline. Has resumed cleaning with alcohol, soap and Neosporin abx. Accompanied by her husband today.   Review of Systems     Objective:   Physical Exam  Constitutional: She appears well-developed and well-nourished. No distress.  Skin:  Right anterio-lateral breast with linear area of non-confluent rash with a few 3-4 mm.papules/no vesicles or drainage.       Assessment:    1. Tick bite - doxycycline (VIBRA-TABS) 100 MG tablet; Take 1 tablet (100 mg total) by mouth 2 (two) times daily.  Dispense: 14 tablet; Refill: 0  2. Intertrigo: ? Fungal ? Contact reaction  - fluocinonide cream (LIDEX) 0.05 %; Apply 1 application topically 3 (three) times daily. To rash under your breast  Dispense: 15 g; Refill: 0        Discussed using clotrimazole cream with the steroid cream 3 x day.

## 2015-09-25 ENCOUNTER — Telehealth: Payer: Self-pay | Admitting: Physician Assistant

## 2015-09-25 NOTE — Telephone Encounter (Signed)
Pt's husband Jeanell Sparrow is requesting that pt be seen today for swelling in the right side of her neck under her ear. Ray stated that he thinks it might be fluid. Can pt be worked in today? Please advise. Thanks TNP

## 2015-09-25 NOTE — Telephone Encounter (Signed)
Please review-aa 

## 2015-09-25 NOTE — Telephone Encounter (Signed)
I have no openings today unfortunately since I worked in that other patient. If she can wait until tomorrow you can try to find a place to work her in tomorrow. If not she should go to Urgent Care.

## 2015-09-25 NOTE — Telephone Encounter (Signed)
Pt advised and will wait and see you tomorrow, appt made-aa

## 2015-09-26 ENCOUNTER — Encounter: Payer: Self-pay | Admitting: Physician Assistant

## 2015-09-26 ENCOUNTER — Ambulatory Visit (INDEPENDENT_AMBULATORY_CARE_PROVIDER_SITE_OTHER): Payer: BLUE CROSS/BLUE SHIELD | Admitting: Physician Assistant

## 2015-09-26 VITALS — BP 136/84 | HR 84 | Temp 99.3°F | Resp 16 | Wt 166.0 lb

## 2015-09-26 DIAGNOSIS — I889 Nonspecific lymphadenitis, unspecified: Secondary | ICD-10-CM | POA: Diagnosis not present

## 2015-09-26 DIAGNOSIS — R05 Cough: Secondary | ICD-10-CM

## 2015-09-26 DIAGNOSIS — J0140 Acute pansinusitis, unspecified: Secondary | ICD-10-CM

## 2015-09-26 DIAGNOSIS — R059 Cough, unspecified: Secondary | ICD-10-CM

## 2015-09-26 MED ORDER — HYDROCODONE-HOMATROPINE 5-1.5 MG/5ML PO SYRP
5.0000 mL | ORAL_SOLUTION | Freq: Three times a day (TID) | ORAL | 0 refills | Status: DC | PRN
Start: 2015-09-26 — End: 2016-01-22

## 2015-09-26 MED ORDER — AMOXICILLIN-POT CLAVULANATE 875-125 MG PO TABS: 1.0000 | ORAL_TABLET | Freq: Two times a day (BID) | ORAL | 0 refills | Status: DC

## 2015-09-26 NOTE — Patient Instructions (Addendum)
Sinusitis, Adult Sinusitis is redness, soreness, and inflammation of the paranasal sinuses. Paranasal sinuses are air pockets within the bones of your face. They are located beneath your eyes, in the middle of your forehead, and above your eyes. In healthy paranasal sinuses, mucus is able to drain out, and air is able to circulate through them by way of your nose. However, when your paranasal sinuses are inflamed, mucus and air can become trapped. This can allow bacteria and other germs to grow and cause infection. Sinusitis can develop quickly and last only a short time (acute) or continue over a long period (chronic). Sinusitis that lasts for more than 12 weeks is considered chronic. CAUSES Causes of sinusitis include:  Allergies.  Structural abnormalities, such as displacement of the cartilage that separates your nostrils (deviated septum), which can decrease the air flow through your nose and sinuses and affect sinus drainage.  Functional abnormalities, such as when the small hairs (cilia) that line your sinuses and help remove mucus do not work properly or are not present. SIGNS AND SYMPTOMS Symptoms of acute and chronic sinusitis are the same. The primary symptoms are pain and pressure around the affected sinuses. Other symptoms include:  Upper toothache.  Earache.  Headache.  Bad breath.  Decreased sense of smell and taste.  A cough, which worsens when you are lying flat.  Fatigue.  Fever.  Thick drainage from your nose, which often is green and may contain pus (purulent).  Swelling and warmth over the affected sinuses. DIAGNOSIS Your health care provider will perform a physical exam. During your exam, your health care provider may perform any of the following to help determine if you have acute sinusitis or chronic sinusitis:  Look in your nose for signs of abnormal growths in your nostrils (nasal polyps).  Tap over the affected sinus to check for signs of  infection.  View the inside of your sinuses using an imaging device that has a light attached (endoscope). If your health care provider suspects that you have chronic sinusitis, one or more of the following tests may be recommended:  Allergy tests.  Nasal culture. A sample of mucus is taken from your nose, sent to a lab, and screened for bacteria.  Nasal cytology. A sample of mucus is taken from your nose and examined by your health care provider to determine if your sinusitis is related to an allergy. TREATMENT Most cases of acute sinusitis are related to a viral infection and will resolve on their own within 10 days. Sometimes, medicines are prescribed to help relieve symptoms of both acute and chronic sinusitis. These may include pain medicines, decongestants, nasal steroid sprays, or saline sprays. However, for sinusitis related to a bacterial infection, your health care provider will prescribe antibiotic medicines. These are medicines that will help kill the bacteria causing the infection. Rarely, sinusitis is caused by a fungal infection. In these cases, your health care provider will prescribe antifungal medicine. For some cases of chronic sinusitis, surgery is needed. Generally, these are cases in which sinusitis recurs more than 3 times per year, despite other treatments. HOME CARE INSTRUCTIONS  Drink plenty of water. Water helps thin the mucus so your sinuses can drain more easily.  Use a humidifier.  Inhale steam 3-4 times a day (for example, sit in the bathroom with the shower running).  Apply a warm, moist washcloth to your face 3-4 times a day, or as directed by your health care provider.  Use saline nasal sprays to help   moisten and clean your sinuses.  Take medicines only as directed by your health care provider.  If you were prescribed either an antibiotic or antifungal medicine, finish it all even if you start to feel better. SEEK IMMEDIATE MEDICAL CARE IF:  You have  increasing pain or severe headaches.  You have nausea, vomiting, or drowsiness.  You have swelling around your face.  You have vision problems.  You have a stiff neck.  You have difficulty breathing.   This information is not intended to replace advice given to you by your health care provider. Make sure you discuss any questions you have with your health care provider.   Document Released: 12/28/2004 Document Revised: 01/18/2014 Document Reviewed: 01/12/2011 Elsevier Interactive Patient Education 2016 Else[employer] Lymphadenopathy Lymphadenopathy refers to swollen or enlarged lymph glands, also called lymph nodes. Lymph glands are part of your body's defense (immune) system, which protects the body from infections, germs, and diseases. Lymph glands are found in many locations in your body, including the neck, underarm, and groin.  Many things can cause lymph glands to become enlarged. When your immune system responds to germs, such as viruses or bacteria, infection-fighting cells and fluid build up. This causes the glands to grow in size. Usually, this is not something to worry about. The swelling and any soreness often go away without treatment. However, swollen lymph glands can also be caused by a number of diseases. Your health care provider may do various tests to help determine the cause. If the cause of your swollen lymph glands cannot be found, it is important to monitor your condition to make sure the swelling goes away. HOME CARE INSTRUCTIONS Watch your condition for any changes. The following actions may help to lessen any discomfort you are feeling:  Get plenty of rest.  Take medicines only as directed by your health care provider. Your health care provider may recommend over-the-counter medicines for pain.  Apply moist heat compresses to the site of swollen lymph nodes as directed by your health care provider. This can help reduce any pain.  Check your lymph nodes daily for  any changes.  Keep all follow-up visits as directed by your health care provider. This is important. SEEK MEDICAL CARE IF:  Your lymph nodes are still swollen after 2 weeks.  Your swelling increases or spreads to other areas.  Your lymph nodes are hard, seem fixed to the skin, or are growing rapidly.  Your skin over the lymph nodes is red and inflamed.  You have a fever.  You have chills.  You have fatigue.  You develop a sore throat.  You have abdominal pain.  You have weight loss.  You have night sweats. SEEK IMMEDIATE MEDICAL CARE IF:  You notice fluid leaking from the area of the enlarged lymph node.  You have severe pain in any area of your body.  You have chest pain.  You have shortness of breath.   This information is not intended to replace advice given to you by your health care provider. Make sure you discuss any questions you have with your health care provider.   Document Released: 10/07/2007 Document Revised: 01/18/2014 Document Reviewed: 08/02/2013 Elsevier Interactive Patient Education [employer].

## 2015-09-26 NOTE — Progress Notes (Signed)
Patient: Katie Bass Female    DOB: 1950/01/24   65 y.o.   MRN: [id number] Visit Date: 09/26/2015  Today's Provider: Mar Daring, PA-C   Chief Complaint  Patient presents with  . Neck Pain  . URI   Subjective:    HPI Patient reports that she has had swelling in her neck in the last 2 days. Patient reports that it comes and goes. She reports that she has had this before in the past, but it went away on its own. Patient also mentions that it was tender when she touches it. She also describes it as being a "pillow" around her neck. She reports that it was difficult for her to move her head to the right due to the tenderness.  Patient also reports that she has had a URI that has been going on for about 1 month. Patient's symptoms include sinus congestion, headache, and dizziness. Patient is also requesting another refill for meclizine.     Allergies  Allergen Reactions  . Sulfa Antibiotics Itching and Swelling     Current Outpatient Prescriptions:  .  Ergocalciferol (VITAMIN D2) 2000 UNITS TABS, Take 1 tablet by mouth daily., Disp: , Rfl:  .  Fexofenadine-Pseudoephedrine (ALLEGRA-D 24 HOUR PO), Take 1 tablet by mouth daily., Disp: , Rfl:  .  fluticasone (FLONASE) 50 MCG/ACT nasal spray, Place 2 sprays into both nostrils daily., Disp: 16 g, Rfl: 11 .  meclizine (ANTIVERT) 25 MG tablet, Take 1 tablet (25 mg total) by mouth 3 (three) times daily as needed for dizziness., Disp: 30 tablet, Rfl: 11 .  montelukast (SINGULAIR) 10 MG tablet, Take 1 tablet (10 mg total) by mouth at bedtime., Disp: 30 tablet, Rfl: 11 .  Multiple Vitamin tablet, , Disp: , Rfl:  .  Omega-3 Fatty Acids (FISH OIL) 1000 MG CAPS, Take by mouth., Disp: , Rfl:   Review of Systems  Constitutional: Positive for fatigue.  HENT: Positive for congestion, facial swelling, postnasal drip, rhinorrhea, sinus pressure and sore throat. Negative for ear discharge, ear pain, sneezing and trouble swallowing.   Eyes:  Negative for visual disturbance.  Respiratory: Positive for cough. Negative for chest tightness, shortness of breath and wheezing.   Cardiovascular: Negative for chest pain, palpitations and leg swelling.  Gastrointestinal: Negative for abdominal pain and nausea.  Musculoskeletal: Positive for myalgias, neck pain and neck stiffness.  Neurological: Positive for dizziness and headaches. Negative for weakness.    Social History  Substance Use Topics  . Smoking status: Former Research scientist (life sciences)  . Smokeless tobacco: Not on file     Comment: smoked <1pack if cigarettesper day, for 1 year in her 84s  . Alcohol use Yes     Comment: 1 cocktail a month   Objective:   BP 136/84 (BP Location: Right Arm, Patient Position: Sitting, Cuff Size: Normal)   Pulse 84   Temp 99.3 F (37.4 C)   Resp 16   Wt 166 lb (75.3 kg)   BMI 29.41 kg/m   Physical Exam  Constitutional: She appears well-developed and well-nourished. No distress.  HENT:  Head: Normocephalic and atraumatic.  Right Ear: Hearing, tympanic membrane, external ear and ear canal normal.  Left Ear: Hearing, tympanic membrane, external ear and ear canal normal.  Nose: Mucosal edema and rhinorrhea present. Right sinus exhibits maxillary sinus tenderness and frontal sinus tenderness. Left sinus exhibits maxillary sinus tenderness and frontal sinus tenderness.  Mouth/Throat: Uvula is midline and mucous membranes are normal. Posterior oropharyngeal erythema (  cobblestoning) present. No oropharyngeal exudate or posterior oropharyngeal edema.  Neck: Normal range of motion. Neck supple. No tracheal deviation present. No thyromegaly present.  Cardiovascular: Normal rate, regular rhythm and normal heart sounds.  Exam reveals no gallop and no friction rub.   No murmur heard. Pulmonary/Chest: Effort normal and breath sounds normal. No stridor. No respiratory distress. She has no wheezes. She has no rales.  Lymphadenopathy:       Head (right side): No submental,  no submandibular, no tonsillar, no preauricular, no posterior auricular and no occipital adenopathy present.       Head (left side): No submental, no submandibular, no tonsillar, no preauricular, no posterior auricular and no occipital adenopathy present.    She has cervical adenopathy.       Right cervical: Superficial cervical (supraclavicular) adenopathy present. No deep cervical and no posterior cervical adenopathy present.      Left cervical: No superficial cervical, no deep cervical and no posterior cervical adenopathy present.    She has no axillary adenopathy.  Skin: She is not diaphoretic.  Vitals reviewed.      Assessment & Plan:     1. Acute pansinusitis, recurrence not specified Worsening symptoms over the last month that has not responded to over-the-counter treatments. I will give her Augmentin as below. She needs to make sure to stay well-hydrated and try to get plenty of rest. She is to call the office if symptoms fail to improve. - amoxicillin-clavulanate (AUGMENTIN) 875-125 MG tablet; Take 1 tablet by mouth 2 (two) times daily.  Dispense: 20 tablet; Refill: 0  2. Cough Worsening nighttime cough that is affecting sleep. I will give her Hycodan cough syrup as below. She is to call the office if cough does not improve. - HYDROcodone-homatropine (HYCODAN) 5-1.5 MG/5ML syrup; Take 5 mLs by mouth every 8 (eight) hours as needed.  Dispense: 180 mL; Refill: 0  3. Lymphadenitis Supraclavicular lymph node noted that is enlarged and tender to palpation on the right side. Most likely secondary to sinus infection. Will treat with Augmentin. She is to call the office if the lymph node does not decrease in size following treatment. If there is no improvement will consider ultrasound. - amoxicillin-clavulanate (AUGMENTIN) 875-125 MG tablet; Take 1 tablet by mouth 2 (two) times daily.  Dispense: 20 tablet; Refill: 0       Mar Daring, PA-C  [facility]

## 2015-10-22 ENCOUNTER — Ambulatory Visit (INDEPENDENT_AMBULATORY_CARE_PROVIDER_SITE_OTHER): Payer: BLUE CROSS/BLUE SHIELD | Admitting: Physician Assistant

## 2015-10-22 DIAGNOSIS — Z23 Encounter for immunization: Secondary | ICD-10-CM

## 2015-11-04 ENCOUNTER — Ambulatory Visit
Admission: RE | Admit: 2015-11-04 | Discharge: 2015-11-04 | Disposition: A | Discharge status: Home / Self Care | Payer: BLUE CROSS/BLUE SHIELD | Source: Ambulatory Visit | Attending: Physician Assistant | Admitting: Physician Assistant

## 2015-11-04 ENCOUNTER — Other Ambulatory Visit: Payer: Self-pay | Admitting: Physician Assistant

## 2015-11-04 DIAGNOSIS — Z1231 Encounter for screening mammogram for malignant neoplasm of breast: Secondary | ICD-10-CM

## 2015-11-06 ENCOUNTER — Telehealth: Payer: Self-pay

## 2015-11-06 NOTE — Telephone Encounter (Signed)
Patient advised as below.  

## 2015-11-06 NOTE — Telephone Encounter (Signed)
-----   Message from Mar Daring, [place] sent at 11/05/2015  5:41 PM EDT ----- Normal mammogram. Repeat screening in one year.

## 2015-12-29 ENCOUNTER — Encounter: Payer: Self-pay | Admitting: Physician Assistant

## 2015-12-29 ENCOUNTER — Ambulatory Visit (INDEPENDENT_AMBULATORY_CARE_PROVIDER_SITE_OTHER): Payer: BLUE CROSS/BLUE SHIELD | Admitting: Physician Assistant

## 2015-12-29 ENCOUNTER — Encounter: Payer: BLUE CROSS/BLUE SHIELD | Admitting: Physician Assistant

## 2015-12-29 VITALS — BP 140/70 | HR 72 | Temp 98.3°F | Resp 16 | Ht 63.0 in | Wt 166.2 lb

## 2015-12-29 DIAGNOSIS — Z0000 Encounter for general adult medical examination without abnormal findings: Secondary | ICD-10-CM | POA: Diagnosis not present

## 2015-12-29 DIAGNOSIS — Z13220 Encounter for screening for lipoid disorders: Secondary | ICD-10-CM

## 2015-12-29 DIAGNOSIS — Z131 Encounter for screening for diabetes mellitus: Secondary | ICD-10-CM

## 2015-12-29 DIAGNOSIS — Z136 Encounter for screening for cardiovascular disorders: Secondary | ICD-10-CM | POA: Diagnosis not present

## 2015-12-29 DIAGNOSIS — Z9109 Other allergy status, other than to drugs and biological substances: Secondary | ICD-10-CM | POA: Diagnosis not present

## 2015-12-29 DIAGNOSIS — R42 Dizziness and giddiness: Secondary | ICD-10-CM | POA: Diagnosis not present

## 2015-12-29 DIAGNOSIS — Z1159 Encounter for screening for other viral diseases: Secondary | ICD-10-CM | POA: Diagnosis not present

## 2015-12-29 MED ORDER — MONTELUKAST SODIUM 10 MG PO TABS: 10.0000 mg | ORAL_TABLET | Freq: Every day | ORAL | 11 refills | Status: DC

## 2015-12-29 MED ORDER — MECLIZINE HCL 25 MG PO TABS: 25.0000 mg | ORAL_TABLET | Freq: Three times a day (TID) | ORAL | 11 refills | Status: DC | PRN

## 2015-12-29 NOTE — Patient Instructions (Signed)

## 2015-12-29 NOTE — Progress Notes (Signed)
Patient: Katie Bass, Female    DOB: 1951/01/04, 65 y.o.   MRN: [id number] Visit Date: 12/29/2015  Today's Provider: Mar Daring, PA-C   Chief Complaint  Patient presents with  . Annual Exam   Subjective:    Annual physical exam Katie Bass is a 65 y.o. female who presents today for health maintenance and complete physical. She feels well. She reports exercising none. She reports she is sleeping fairly well. Patient is requesting medication refills on Meclizine she uses this as needed for her dizziness and Montelukast for allergies.  Last PCP:10/31/14 Mammogram:11/04/15 BI-RADS 1 Pap: 11/01/14 Normal -----------------------------------------------------------------   Review of Systems  Constitutional: Negative.   HENT: Negative.   Eyes: Negative.   Respiratory: Negative.   Cardiovascular: Negative.   Gastrointestinal: Negative.   Endocrine: Negative.   Genitourinary: Negative.   Musculoskeletal: Negative.   Skin: Negative.   Allergic/Immunologic: Negative.   Neurological: Negative.   Hematological: Negative.   Psychiatric/Behavioral: Negative.     Social History      She  reports that she has quit smoking. She has never used smokeless tobacco. She reports that she does not drink alcohol or use drugs.       Social History   Social History  . Marital status: Married    Spouse name: N/A  . Number of children: N/A  . Years of education: N/A   Social History Main Topics  . Smoking status: Former Research scientist (life sciences)  . Smokeless tobacco: Never Used     Comment: smoked <1pack if cigarettesper day, for 1 year in her 36s  . Alcohol use No  . Drug use: No  . Sexual activity: Not Asked   Other Topics Concern  . None   Social History Narrative  . None    Past Medical History:  Diagnosis Date  . Asthma   . Depression      Patient Active Problem List   Diagnosis Date Noted  . Need for hepatitis C screening test 12/29/2015  . Extrinsic asthma with  exacerbation 10/29/2014  . Clinical depression 10/29/2014  . Disease of pharynx 10/29/2014  . Positional vertigo 10/29/2014  . Fibroid 10/29/2014  . Avitaminosis D 10/29/2014  . Allergic rhinitis 08/01/2008  . Dermatitis seborrheica 08/01/2008    Past Surgical History:  Procedure Laterality Date  . NASAL SINUS SURGERY  2004    Family History        Family Status  Relation Status  . Mother Deceased   cause of death was ovarian cancer and uterine cancer  . Father Deceased   cause of death was stroke  . Brother Alive  . Daughter Alive  . Son Alive  . Sister Alive        Her family history includes Breast cancer (age of onset: 54) in her sister; Cancer in her mother.     Allergies  Allergen Reactions  . Sulfa Antibiotics Itching and Swelling     Current Outpatient Prescriptions:  .  Ergocalciferol (VITAMIN D2) 2000 UNITS TABS, Take 1 tablet by mouth daily., Disp: , Rfl:  .  Fexofenadine-Pseudoephedrine (ALLEGRA-D 24 HOUR PO), Take 1 tablet by mouth daily., Disp: , Rfl:  .  fluticasone (FLONASE) 50 MCG/ACT nasal spray, Place 2 sprays into both nostrils daily., Disp: 16 g, Rfl: 11 .  meclizine (ANTIVERT) 25 MG tablet, Take 1 tablet (25 mg total) by mouth 3 (three) times daily as needed for dizziness., Disp: 30 tablet, Rfl: 11 .  montelukast (SINGULAIR) 10  MG tablet, Take 1 tablet (10 mg total) by mouth at bedtime., Disp: 30 tablet, Rfl: 11 .  Multiple Vitamin tablet, , Disp: , Rfl:  .  Omega-3 Fatty Acids (FISH OIL) 1000 MG CAPS, Take by mouth., Disp: , Rfl:  .  amoxicillin-clavulanate (AUGMENTIN) 875-125 MG tablet, Take 1 tablet by mouth 2 (two) times daily. (Patient not taking: Reported on 12/29/2015), Disp: 20 tablet, Rfl: 0 .  HYDROcodone-homatropine (HYCODAN) 5-1.5 MG/5ML syrup, Take 5 mLs by mouth every 8 (eight) hours as needed. (Patient not taking: Reported on 12/29/2015), Disp: 180 mL, Rfl: 0   Patient Care Team: Mar Daring, PA-C as PCP - General (Physician  Assistant)      Objective:   Vitals: BP 140/70 (BP Location: Right Arm, Patient Position: Sitting, Cuff Size: Normal)   Pulse 72   Temp 98.3 F (36.8 C) (Oral)   Resp 16   Ht 5\' 3"  (1.6 m)   Wt 166 lb 3.2 oz (75.4 kg)   BMI 29.44 kg/m    Physical Exam  Constitutional: She is oriented to person, place, and time. She appears well-developed and well-nourished. No distress.  HENT:  Head: Normocephalic and atraumatic.  Right Ear: Tympanic membrane, external ear and ear canal normal.  Left Ear: Tympanic membrane, external ear and ear canal normal.  Nose: Nose normal.  Mouth/Throat: Uvula is midline, oropharynx is clear and moist and mucous membranes are normal. No oropharyngeal exudate.  Eyes: Conjunctivae and EOM are normal. Pupils are equal, round, and reactive to light. Right eye exhibits no discharge. Left eye exhibits no discharge. No scleral icterus.  Neck: Normal range of motion. Neck supple. No JVD present. Carotid bruit is not present. No tracheal deviation present. No thyromegaly present.  Cardiovascular: Normal rate, regular rhythm, normal heart sounds and intact distal pulses.  Exam reveals no gallop and no friction rub.   No murmur heard. Pulmonary/Chest: Effort normal and breath sounds normal. No respiratory distress. She has no wheezes. She has no rales. She exhibits no tenderness.  Abdominal: Soft. Bowel sounds are normal. She exhibits no distension and no mass. There is no tenderness. There is no rebound and no guarding.  Musculoskeletal: Normal range of motion. She exhibits no edema or tenderness.  Lymphadenopathy:    She has no cervical adenopathy.  Neurological: She is alert and oriented to person, place, and time. She has normal reflexes.  Skin: Skin is warm and dry. No rash noted. She is not diaphoretic.  Psychiatric: She has a normal mood and affect. Her behavior is normal. Judgment and thought content normal.  Vitals reviewed.    Depression Screen No  flowsheet data found.    Assessment & Plan:     Routine Health Maintenance and Physical Exam  Exercise Activities and Dietary recommendations Goals    None      Immunization History  Administered Date(s) Administered  . Influenza Whole 10/20/2009  . Influenza,inj,Quad PF,36+ Mos 10/10/2014, 10/22/2015    Health Maintenance  Topic Date Due  . Hepatitis C Screening  04-10-1950  . HIV Screening  09/14/1966  . TETANUS/TDAP  09/14/1970  . ZOSTAVAX  09/14/2011  . PAP SMEAR  10/31/2017  . MAMMOGRAM  11/03/2017  . COLONOSCOPY  06/08/2018  . INFLUENZA VACCINE  Completed     Discussed health benefits of physical activity, and encouraged her to engage in regular exercise appropriate for her age and condition.    1. Annual physical exam Normal physical exam today. Will check labs as below and  f/u pending lab results. If labs are stable and WNL she will not need to have these rechecked for one year at her next annual physical exam. She is to call the office in the meantime if she has any acute issue, questions or concerns. - CBC with Differential/Platelet - Comprehensive metabolic panel - TSH  2. Need for hepatitis C screening test - Hepatitis C antibody  3. Vertigo Stable. Diagnosis pulled for medication refill. Continue current medical treatment plan. - meclizine (ANTIVERT) 25 MG tablet; Take 1 tablet (25 mg total) by mouth 3 (three) times daily as needed for dizziness.  Dispense: 30 tablet; Refill: 11  4. Environmental allergies Stable. Diagnosis pulled for medication refill. Continue current medical treatment plan. - montelukast (SINGULAIR) 10 MG tablet; Take 1 tablet (10 mg total) by mouth at bedtime.  Dispense: 30 tablet; Refill: 11  5. Diabetes mellitus screening Will check labs as below and f/u pending results. - Hemoglobin A1c  6. Encounter for lipid screening for cardiovascular disease Will check labs as below and f/u pending results. - Lipid  panel  --------------------------------------------------------------------    Mar Daring, PA-C  [facility]

## 2015-12-30 ENCOUNTER — Telehealth: Payer: Self-pay

## 2015-12-30 LAB — COMPREHENSIVE METABOLIC PANEL
A/G RATIO: 2 (ref 1.2–2.2)
ALT: 13 IU/L (ref 0–32)
AST: 17 IU/L (ref 0–40)
Albumin: 4.3 g/dL (ref 3.6–4.8)
Alkaline Phosphatase: 103 IU/L (ref 39–117)
BILIRUBIN TOTAL: 0.2 mg/dL (ref 0.0–1.2)
BUN/Creatinine Ratio: 14 (ref 12–28)
BUN: 11 mg/dL (ref 8–27)
CHLORIDE: 102 mmol/L (ref 96–106)
CO2: 27 mmol/L (ref 18–29)
Calcium: 9.7 mg/dL (ref 8.7–10.3)
Creatinine, Ser: 0.76 mg/dL (ref 0.57–1.00)
GFR calc non Af Amer: 83 mL/min/{1.73_m2} (ref >59)
GFR, EST AFRICAN AMERICAN: 96 mL/min/{1.73_m2} (ref >59)
GLUCOSE: 88 mg/dL (ref 65–99)
Globulin, Total: 2.1 g/dL (ref 1.5–4.5)
POTASSIUM: 4.1 mmol/L (ref 3.5–5.2)
Sodium: 139 mmol/L (ref 134–144)
Total Protein: 6.4 g/dL (ref 6.0–8.5)

## 2015-12-30 LAB — CBC WITH DIFFERENTIAL/PLATELET
BASOS ABS: 0 10*3/uL (ref 0.0–0.2)
Basos: 0 %
EOS (ABSOLUTE): 0.1 10*3/uL (ref 0.0–0.4)
Eos: 2 %
Hematocrit: 41.1 % (ref 34.0–46.6)
Hemoglobin: 13.7 g/dL (ref 11.1–15.9)
IMMATURE GRANS (ABS): 0 10*3/uL (ref 0.0–0.1)
IMMATURE GRANULOCYTES: 0 %
LYMPHS: 42 %
Lymphocytes Absolute: 2.4 10*3/uL (ref 0.7–3.1)
MCH: 28.9 pg (ref 26.6–33.0)
MCHC: 33.3 g/dL (ref 31.5–35.7)
MCV: 87 fL (ref 79–97)
MONOS ABS: 0.4 10*3/uL (ref 0.1–0.9)
Monocytes: 6 %
NEUTROS ABS: 2.8 10*3/uL (ref 1.4–7.0)
NEUTROS PCT: 50 %
PLATELETS: 227 10*3/uL (ref 150–379)
RBC: 4.74 x10E6/uL (ref 3.77–5.28)
RDW: 13.7 % (ref 12.3–15.4)
WBC: 5.7 10*3/uL (ref 3.4–10.8)

## 2015-12-30 LAB — LIPID PANEL
CHOLESTEROL TOTAL: 217 mg/dL — AB (ref 100–199)
Chol/HDL Ratio: 3.5 ratio units (ref 0.0–4.4)
HDL: 62 mg/dL (ref >39)
LDL Calculated: 116 mg/dL — ABNORMAL HIGH (ref 0–99)
Triglycerides: 196 mg/dL — ABNORMAL HIGH (ref 0–149)
VLDL Cholesterol Cal: 39 mg/dL (ref 5–40)

## 2015-12-30 LAB — HEMOGLOBIN A1C
Est. average glucose Bld gHb Est-mCnc: 103 mg/dL
HEMOGLOBIN A1C: 5.2 % (ref 4.8–5.6)

## 2015-12-30 LAB — TSH: TSH: 1.16 u[IU]/mL (ref 0.450–4.500)

## 2015-12-30 LAB — HEPATITIS C ANTIBODY: HEP C VIRUS AB: 0.2 {s_co_ratio} (ref 0.0–0.9)

## 2015-12-30 NOTE — Telephone Encounter (Signed)
Patient advised as below. Patient verbalizes understanding and is in agreement with treatment plan.  

## 2015-12-30 NOTE — Telephone Encounter (Signed)
-----   Message from Mar Daring, PA-C sent at 12/30/2015  8:24 AM EST ----- Cholesterol still borderline high. Continue healthy lifestyle modifications. All other labs are WNL.

## 2015-12-30 NOTE — Telephone Encounter (Signed)
LMTCB

## 2016-01-21 ENCOUNTER — Encounter: Payer: BLUE CROSS/BLUE SHIELD | Admitting: Physician Assistant

## 2016-01-22 ENCOUNTER — Encounter: Payer: Self-pay | Admitting: Family Medicine

## 2016-01-22 ENCOUNTER — Ambulatory Visit (INDEPENDENT_AMBULATORY_CARE_PROVIDER_SITE_OTHER): Payer: BLUE CROSS/BLUE SHIELD | Admitting: Family Medicine

## 2016-01-22 VITALS — BP 142/78 | HR 76 | Temp 98.4°F | Resp 17 | Wt 162.6 lb

## 2016-01-22 DIAGNOSIS — B9789 Other viral agents as the cause of diseases classified elsewhere: Secondary | ICD-10-CM | POA: Diagnosis not present

## 2016-01-22 DIAGNOSIS — J069 Acute upper respiratory infection, unspecified: Secondary | ICD-10-CM | POA: Diagnosis not present

## 2016-01-22 MED ORDER — HYDROCODONE-HOMATROPINE 5-1.5 MG/5ML PO SYRP: ORAL_SOLUTION | ORAL | 0 refills | Status: DC

## 2016-01-22 MED ORDER — AMOXICILLIN-POT CLAVULANATE 875-125 MG PO TABS: 1.0000 | ORAL_TABLET | Freq: Two times a day (BID) | ORAL | 0 refills | Status: DC

## 2016-01-22 NOTE — Progress Notes (Signed)
Subjective:     Patient ID: Katie Bass, female   DOB: 02-Jan-1951, 67 y.o.   MRN: [id number]  HPI  Chief Complaint  Patient presents with  . Cough    Patient comes in office today with concerns of cough and congestion since 01/16/16. Patient reports the followingh symptoms; hoarsness, chest/head congestion, bilateral eye pain, sore throat and rib pain. Patient has taken otc Mucinex and Mucinex Cough and Cold.    Patient reports increased sinus pressure, purulent to clear sinus drainage, post nasal drainage and accompanying cough.   Review of Systems     Objective:   Physical Exam  Constitutional: She appears well-developed and well-nourished. She has a sickly appearance (frequent throat clearing and cough). No distress.  Ears: T.M's intact without inflammation. Left TM dull in appearance Sinuses: mild frontal and maxillary sinus tenderness Throat: no tonsillar enlargement or exudate; mild posterior pharyngeal erythema Neck: no cervical adenopathy Lungs: clear     Assessment:    1. Viral upper respiratory tract infection - amoxicillin-clavulanate (AUGMENTIN) 875-125 MG tablet; Take 1 tablet by mouth 2 (two) times daily.  Dispense: 20 tablet; Refill: 0 - HYDROcodone-homatropine (HYCODAN) 5-1.5 MG/5ML syrup; 5 ml 4-6 hours as needed for cough  Dispense: 240 mL; Refill: 0    Plan:   Start Mucinex D and cough syrup. If sinuses not improving over the next two days to start abx

## 2016-01-22 NOTE — Patient Instructions (Signed)
Discussed use of Mucinex D and cough syrup. If sinuses not improving over the next two days to start the antibiotic.

## 2016-02-05 ENCOUNTER — Encounter: Payer: Self-pay | Admitting: Family Medicine

## 2016-02-05 ENCOUNTER — Ambulatory Visit (INDEPENDENT_AMBULATORY_CARE_PROVIDER_SITE_OTHER): Payer: BLUE CROSS/BLUE SHIELD | Admitting: Family Medicine

## 2016-02-05 VITALS — BP 124/70 | HR 91 | Temp 98.3°F | Resp 17 | Wt 162.4 lb

## 2016-02-05 DIAGNOSIS — J0100 Acute maxillary sinusitis, unspecified: Secondary | ICD-10-CM

## 2016-02-05 MED ORDER — AMOXICILLIN-POT CLAVULANATE 875-125 MG PO TABS: 1.0000 | ORAL_TABLET | Freq: Two times a day (BID) | ORAL | 0 refills | Status: DC

## 2016-02-05 MED ORDER — HYDROCODONE-HOMATROPINE 5-1.5 MG/5ML PO SYRP: ORAL_SOLUTION | ORAL | 0 refills | Status: DC

## 2016-02-05 NOTE — Progress Notes (Signed)
Subjective:     Patient ID: Katie Bass, female   DOB: 02-May-1950, 66 y.o.   MRN: [id number]  HPI  Chief Complaint  Patient presents with  . Cough    Patient comes in office today for follow up URI. Patient reports since last office visit on 01/22/16 cough has been worse. Patient states that cough is painful and productive, patient reports flank pain and shortness of breath associated.   States her sinuses got worse and she initiated the Augmentin. Reports that she felt her sinuses were getting better but once she completed the abx intermittent right sided sinus congestion, PND, and accompanying cough persisted.   Review of Systems     Objective:   Physical Exam  Constitutional: She appears well-developed and well-nourished. No distress.  Ears: T.M's intact without inflammation Sinuses: mild maxillary sinus tenderness Throat: no tonsillar enlargement or exudate Neck: no cervical adenopathy Lungs: clear     Assessment:    1. Acute non-recurrent maxillary sinusitis - amoxicillin-clavulanate (AUGMENTIN) 875-125 MG tablet; Take 1 tablet by mouth 2 (two) times daily.  Dispense: 20 tablet; Refill: 0 - HYDROcodone-homatropine (HYCODAN) 5-1.5 MG/5ML syrup; 5 ml 4-6 hours as needed for cough  Dispense: 240 mL; Refill: 0      Plan:    Phone f/u if not resolved.

## 2016-02-05 NOTE — Patient Instructions (Signed)
Let me know when you finish the antibiotic if not better.

## 2016-03-30 ENCOUNTER — Telehealth: Payer: Self-pay

## 2016-03-30 NOTE — Telephone Encounter (Signed)
Do not use alcohol or peroxide on it as this will only make worse. May use warm compress over the area and soak in epsom salt soaks.

## 2016-03-30 NOTE — Telephone Encounter (Signed)
Patient calling that she was coming in to see [facility] tomorrow but canceled appointment because of the weather. Patient is scheduled to see Tawanna Sat 03/22.  Rash Triaged: Per patient she developed a little lump on the left inner thigh(groin area) 6 days ago and thought it was a hair follicle.She reports it was" bothersome" not painful and she tried poking it with a needle but nothing came out. She reports that right after doing that she put alcohol and has peroxide. She started to have some burning and itching a few days ago and she notice little red bumps in that area. Still no drainage or painful.Just an uncomfortable feeling. She has put Neosporin, baking soda around the area. Advise patient that if Tawanna Sat needed to see her sooner or advise anything else for her to try until she sees her Thursday we will be getting in contact with her.  Thanks,  -Levia Waltermire

## 2016-03-30 NOTE — Telephone Encounter (Signed)
LM as directed below. If patient has any question or concerns to call back.  Thanks,  -Castella Lerner

## 2016-03-31 ENCOUNTER — Ambulatory Visit: Payer: BLUE CROSS/BLUE SHIELD | Admitting: Physician Assistant

## 2016-04-01 ENCOUNTER — Ambulatory Visit (INDEPENDENT_AMBULATORY_CARE_PROVIDER_SITE_OTHER): Payer: BLUE CROSS/BLUE SHIELD | Admitting: Physician Assistant

## 2016-04-01 ENCOUNTER — Encounter: Payer: Self-pay | Admitting: Physician Assistant

## 2016-04-01 VITALS — BP 130/88 | HR 80 | Temp 98.3°F | Resp 16 | Wt 160.0 lb

## 2016-04-01 DIAGNOSIS — Z9109 Other allergy status, other than to drugs and biological substances: Secondary | ICD-10-CM

## 2016-04-01 DIAGNOSIS — L259 Unspecified contact dermatitis, unspecified cause: Secondary | ICD-10-CM | POA: Diagnosis not present

## 2016-04-01 MED ORDER — FLUTICASONE PROPIONATE 50 MCG/ACT NA SUSP: 2.0000 | Freq: Every day | NASAL | 11 refills | Status: DC

## 2016-04-01 MED ORDER — TRIAMCINOLONE ACETONIDE 0.1 % EX CREA: 1.0000 "application " | TOPICAL_CREAM | Freq: Three times a day (TID) | CUTANEOUS | 0 refills | Status: DC

## 2016-04-01 NOTE — Progress Notes (Signed)
Patient: Katie Bass Female    DOB: Feb 04, 1950   66 y.o.   MRN: [id number] Visit Date: 04/01/2016  Today's Provider: Mar Daring, PA-C   Chief Complaint  Patient presents with  . Rash   Subjective:    Rash  This is a new problem. The current episode started 1 to 4 weeks ago (x 8 days). The problem has been gradually improving since onset. The affected locations include the left upper leg. The rash is characterized by itchiness, redness and burning. She was exposed to nothing. Associated symptoms include congestion (nasal) and coughing (dry). Pertinent negatives include no anorexia, diarrhea, eye pain, facial edema, fatigue, fever, joint pain, nail changes, rhinorrhea, shortness of breath, sore throat or vomiting. Treatments tried: pt thought that the area was initially an ingrown hair. Pt tried to remove the ingrown hair with a needle. Pt also tried Nystatin cream. The treatment provided no relief.   Allergic Rhinitis Pt os requesting refill of Flonase.    Allergies  Allergen Reactions  . Sulfa Antibiotics Itching and Swelling     Current Outpatient Prescriptions:  .  Ergocalciferol (VITAMIN D2) 2000 UNITS TABS, Take 1 tablet by mouth daily., Disp: , Rfl:  .  fluticasone (FLONASE) 50 MCG/ACT nasal spray, Place 2 sprays into both nostrils daily., Disp: 16 g, Rfl: 11 .  Multiple Vitamin tablet, , Disp: , Rfl:  .  Omega-3 Fatty Acids (FISH OIL) 1000 MG CAPS, Take by mouth., Disp: , Rfl:  .  Fexofenadine-Pseudoephedrine (ALLEGRA-D 24 HOUR PO), Take 1 tablet by mouth daily., Disp: , Rfl:  .  meclizine (ANTIVERT) 25 MG tablet, Take 1 tablet (25 mg total) by mouth 3 (three) times daily as needed for dizziness. (Patient not taking: Reported on 04/01/2016), Disp: 30 tablet, Rfl: 11 .  montelukast (SINGULAIR) 10 MG tablet, Take 1 tablet (10 mg total) by mouth at bedtime. (Patient not taking: Reported on 04/01/2016), Disp: 30 tablet, Rfl: 11  Review of Systems  Constitutional:  Negative for fatigue and fever.  HENT: Positive for congestion (nasal). Negative for rhinorrhea and sore throat.   Eyes: Negative for pain.  Respiratory: Positive for cough (dry). Negative for shortness of breath.   Cardiovascular: Negative for chest pain, palpitations and leg swelling.  Gastrointestinal: Negative for abdominal pain, anorexia, diarrhea and vomiting.  Musculoskeletal: Negative for joint pain.  Skin: Positive for rash. Negative for nail changes.  Allergic/Immunologic: Positive for environmental allergies.    Social History  Substance Use Topics  . Smoking status: Former Research scientist (life sciences)  . Smokeless tobacco: Never Used     Comment: smoked <1pack if cigarettesper day, for 1 year in her 24s  . Alcohol use No   Objective:   BP 130/88 (BP Location: Left Arm, Patient Position: Sitting, Cuff Size: Normal)   Pulse 80   Temp 98.3 F (36.8 C) (Oral)   Resp 16   Wt 160 lb (72.6 kg)   BMI 28.34 kg/m  Vitals:   04/01/16 1536  BP: 130/88  Pulse: 80  Resp: 16  Temp: 98.3 F (36.8 C)  TempSrc: Oral  Weight: 160 lb (72.6 kg)     Physical Exam  Constitutional: She appears well-developed and well-nourished. No distress.  Cardiovascular: Normal rate, regular rhythm and normal heart sounds.  Exam reveals no gallop and no friction rub.   No murmur heard. Pulmonary/Chest: Effort normal and breath sounds normal. No respiratory distress. She has no wheezes. She has no rales.  Skin: She is not  diaphoretic.     Vitals reviewed.     Assessment & Plan:     1. Contact dermatitis, unspecified contact dermatitis type, unspecified trigger Contact dermatitis noted on upper thighs bilaterally. Do not know trigger. Triamcinolone prescribed as below. She is to call if no improvement.  - triamcinolone cream (KENALOG) 0.1 %; Apply 1 application topically 3 (three) times daily.  Dispense: 30 g; Refill: 0  2. Environmental allergies Stable. Diagnosis pulled for medication refill. Continue  current medical treatment plan. - fluticasone (FLONASE) 50 MCG/ACT nasal spray; Place 2 sprays into both nostrils daily.  Dispense: 16 g; Refill: 11     Patient seen and examined by Mar Daring, PA-C, and note scribed by Renaldo Fiddler, CMA.  Mar Daring, PA-C  [facility]

## 2016-04-01 NOTE — Patient Instructions (Signed)
   Contact Dermatitis Dermatitis is redness, soreness, and swelling (inflammation) of the skin. Contact dermatitis is a reaction to certain substances that touch the skin. You either touched something that irritated your skin, or you have allergies to something you touched. Follow these instructions at home: Skin Care   Moisturize your skin as needed.  Apply cool compresses to the affected areas.  Try taking a bath with:  Epsom salts. Follow the instructions on the package. You can get these at a pharmacy or grocery store.  Baking soda. Pour a small amount into the bath as told by your doctor.  Colloidal oatmeal. Follow the instructions on the package. You can get this at a pharmacy or grocery store.  Try applying baking soda paste to your skin. Stir water into baking soda until it looks like paste.  Do not scratch your skin.  Bathe less often.  Bathe in lukewarm water. Avoid using hot water. Medicines   Take or apply over-the-counter and prescription medicines only as told by your doctor.  If you were prescribed an antibiotic medicine, take or apply your antibiotic as told by your doctor. Do not stop taking the antibiotic even if your condition starts to get better. General instructions   Keep all follow-up visits as told by your doctor. This is important.  Avoid the substance that caused your reaction. If you do not know what caused it, keep a journal to try to track what caused it. Write down:  What you eat.  What cosmetic products you use.  What you drink.  What you wear in the affected area. This includes jewelry.  If you were given a bandage (dressing), take care of it as told by your doctor. This includes when to change and remove it. Contact a doctor if:  You do not get better with treatment.  Your condition gets worse.  You have signs of infection such as:  Swelling.  Tenderness.  Redness.  Soreness.  Warmth.  You have a fever.  You have new  symptoms. Get help right away if:  You have a very bad headache.  You have neck pain.  Your neck is stiff.  You throw up (vomit).  You feel very sleepy.  You see red streaks coming from the affected area.  Your bone or joint underneath the affected area becomes painful after the skin has healed.  The affected area turns darker.  You have trouble breathing. This information is not intended to replace advice given to you by your health care provider. Make sure you discuss any questions you have with your health care provider. Document Released: 10/25/2008 Document Revised: 06/05/2015 Document Reviewed: 05/15/2014 Elsevier Interactive Patient Education  2017 [employer]  

## 2016-10-18 ENCOUNTER — Ambulatory Visit (INDEPENDENT_AMBULATORY_CARE_PROVIDER_SITE_OTHER): Payer: Medicare Other | Admitting: Family Medicine

## 2016-10-18 DIAGNOSIS — Z23 Encounter for immunization: Secondary | ICD-10-CM

## 2016-12-15 ENCOUNTER — Other Ambulatory Visit: Payer: Self-pay | Admitting: Physician Assistant

## 2016-12-15 DIAGNOSIS — Z1231 Encounter for screening mammogram for malignant neoplasm of breast: Secondary | ICD-10-CM

## 2016-12-16 ENCOUNTER — Encounter: Payer: Self-pay | Admitting: Physician Assistant

## 2016-12-16 ENCOUNTER — Ambulatory Visit (INDEPENDENT_AMBULATORY_CARE_PROVIDER_SITE_OTHER): Payer: Medicare Other | Admitting: Physician Assistant

## 2016-12-16 VITALS — BP 140/90 | HR 68 | Temp 98.3°F | Resp 16 | Ht 63.0 in | Wt 164.0 lb

## 2016-12-16 DIAGNOSIS — M5442 Lumbago with sciatica, left side: Secondary | ICD-10-CM

## 2016-12-16 DIAGNOSIS — J301 Allergic rhinitis due to pollen: Secondary | ICD-10-CM | POA: Diagnosis not present

## 2016-12-16 DIAGNOSIS — L989 Disorder of the skin and subcutaneous tissue, unspecified: Secondary | ICD-10-CM

## 2016-12-16 MED ORDER — METHOCARBAMOL 500 MG PO TABS: 500.0000 mg | ORAL_TABLET | Freq: Every evening | ORAL | 0 refills | Status: DC | PRN

## 2016-12-16 NOTE — Progress Notes (Signed)
Patient: Katie Bass Female    DOB: 21-Mar-1950   66 y.o.   MRN: [id number] Visit Date: 12/16/2016  Today's Provider: Mar Daring, PA-C   Chief Complaint  Patient presents with  . Back Pain  . Rash  . URI   Subjective:    HPI Patient here today C/O left lower back pain x's 2 months. Patient reports pain starts on left lower back and radiates to left hip and leg. Patient reports pain is worse in the morning.   Patient C/O red spot on right leg X3 months. Patient denies any itching, pain or any changes since she first noticed rash.  Patient reports that she has been using Flonase everyday due to nasal congestion and ears feel full. Patient wants to know if she needs to increase her nasal spray or if she needs to restart other medications for congestion. Patient reports she is not using montelukast x's 9 months.     Allergies  Allergen Reactions  . Sulfa Antibiotics Itching and Swelling     Current Outpatient Medications:  .  Ergocalciferol (VITAMIN D2) 2000 UNITS TABS, Take 1 tablet by mouth daily., Disp: , Rfl:  .  fluticasone (FLONASE) 50 MCG/ACT nasal spray, Place 2 sprays into both nostrils daily., Disp: 16 g, Rfl: 11 .  Multiple Vitamin tablet, , Disp: , Rfl:  .  Omega-3 Fatty Acids (FISH OIL) 1000 MG CAPS, Take by mouth., Disp: , Rfl:  .  triamcinolone cream (KENALOG) 0.1 %, Apply 1 application topically 3 (three) times daily., Disp: 30 g, Rfl: 0 .  Fexofenadine-Pseudoephedrine (ALLEGRA-D 24 HOUR PO), Take 1 tablet by mouth daily., Disp: , Rfl:  .  meclizine (ANTIVERT) 25 MG tablet, Take 1 tablet (25 mg total) by mouth 3 (three) times daily as needed for dizziness. (Patient not taking: Reported on 04/01/2016), Disp: 30 tablet, Rfl: 11 .  montelukast (SINGULAIR) 10 MG tablet, Take 1 tablet (10 mg total) by mouth at bedtime. (Patient not taking: Reported on 04/01/2016), Disp: 30 tablet, Rfl: 11  Review of Systems  Constitutional: Negative for diaphoresis,  fatigue and fever.  HENT: Positive for congestion, postnasal drip and sinus pressure.   Respiratory: Negative.   Cardiovascular: Negative.   Musculoskeletal: Positive for back pain. Negative for gait problem.  Allergic/Immunologic: Positive for environmental allergies.    Social History   Tobacco Use  . Smoking status: Former Research scientist (life sciences)  . Smokeless tobacco: Never Used  . Tobacco comment: smoked <1pack if cigarettesper day, for 1 year in her 4s  Substance Use Topics  . Alcohol use: No   Objective:   BP 140/90 (BP Location: Left Arm, Patient Position: Sitting, Cuff Size: Large)   Pulse 68   Temp 98.3 F (36.8 C)   Resp 16   Ht 5\' 3"  (1.6 m)   Wt 164 lb (74.4 kg)   SpO2 98%   BMI 29.05 kg/m  Vitals:   12/16/16 1525  BP: 140/90  Pulse: 68  Resp: 16  Temp: 98.3 F (36.8 C)  SpO2: 98%  Weight: 164 lb (74.4 kg)  Height: 5\' 3"  (1.6 m)     Physical Exam  Constitutional: She appears well-developed and well-nourished. No distress.  HENT:  Head: Normocephalic and atraumatic.  Right Ear: Hearing, tympanic membrane, external ear and ear canal normal.  Left Ear: Hearing, tympanic membrane, external ear and ear canal normal.  Nose: Nose normal.  Mouth/Throat: Uvula is midline, oropharynx is clear and moist and mucous membranes are  normal. No oropharyngeal exudate.  Eyes: Conjunctivae are normal. Pupils are equal, round, and reactive to light. Right eye exhibits no discharge. Left eye exhibits no discharge. No scleral icterus.  Neck: Normal range of motion. Neck supple. No tracheal deviation present. No thyromegaly present.  Cardiovascular: Normal rate, regular rhythm and normal heart sounds. Exam reveals no gallop and no friction rub.  No murmur heard. Pulmonary/Chest: Effort normal and breath sounds normal. No stridor. No respiratory distress. She has no wheezes. She has no rales.  Musculoskeletal:       Lumbar back: She exhibits tenderness and spasm (paraspinal group on the  left). She exhibits normal range of motion and no bony tenderness.  Lymphadenopathy:    She has no cervical adenopathy.  Skin: Skin is warm and dry. Rash noted. Rash is macular. She is not diaphoretic.     Vitals reviewed.      Assessment & Plan:     1. Acute left-sided low back pain with left-sided sciatica Discussed conservative measures with epsom salt soaks, how to perform self massage using tennis ball, heating pad, back exercises and stretches. Patient was also given robaxin for muscle relaxer but was not covered by insurance thus she was switched to flexeril. Also advised she may use NSAID of choice. She is to call if no improvement. Discussed possible PT referral.   2. Seasonal allergic rhinitis due to pollen Increase flonase to 2 sprays daily x 1 week. She is currently only using 1 spray as needed, not daily.   3. Skin lesion She is going to call her dermatologist to have this evaluated further since there has been no healing for a few months. She is to call me if she needs assistance with getting an appointment.        Mar Daring, PA-C  [facility]

## 2016-12-16 NOTE — Patient Instructions (Addendum)
Ibuprofen 600mg  three times daily [facility] on [place] Add claritin, allegra or zyrtec for allergies  Sciatica Sciatica is pain, numbness, weakness, or tingling along the path of the sciatic nerve. The sciatic nerve starts in the lower back and runs down the back of each leg. The nerve controls the muscles in the lower leg and in the back of the knee. It also provides feeling (sensation) to the back of the thigh, the lower leg, and the sole of the foot. Sciatica is a symptom of another medical condition that pinches or puts pressure on the sciatic nerve. Generally, sciatica only affects one side of the body. Sciatica usually goes away on its own or with treatment. In some cases, sciatica may keep coming back (recur). What are the causes? This condition is caused by pressure on the sciatic nerve, or pinching of the sciatic nerve. This may be the result of:  A disk in between the bones of the spine (vertebrae) bulging out too far (herniated disk).  Age-related changes in the spinal disks (degenerative disk disease).  A pain disorder that affects a muscle in the buttock (piriformis syndrome).  Extra bone growth (bone spur) near the sciatic nerve.  An injury or break (fracture) of the pelvis.  Pregnancy.  Tumor (rare).  What increases the risk? The following factors may make you more likely to develop this condition:  Playing sports that place pressure or stress on the spine, such as football or weight lifting.  Having poor strength and flexibility.  A history of back injury.  A history of back surgery.  Sitting for long periods of time.  Doing activities that involve repetitive bending or lifting.  Obesity.  What are the signs or symptoms? Symptoms can vary from mild to very severe, and they may include:  Any of these problems in the lower back, leg, hip, or buttock: ? Mild tingling or dull aches. ? Burning sensations. ? Sharp pains.  Numbness in the back of the  calf or the sole of the foot.  Leg weakness.  Severe back pain that makes movement difficult.  These symptoms may get worse when you cough, sneeze, or laugh, or when you sit or stand for long periods of time. Being overweight may also make symptoms worse. In some cases, symptoms may recur over time. How is this diagnosed? This condition may be diagnosed based on:  Your symptoms.  A physical exam. Your health care provider may ask you to do certain movements to check whether those movements trigger your symptoms.  You may have tests, including: ? Blood tests. ? X-rays. ? MRI. ? CT scan.  How is this treated? In many cases, this condition improves on its own, without any treatment. However, treatment may include:  Reducing or modifying physical activity during periods of pain.  Exercising and stretching to strengthen your abdomen and improve the flexibility of your spine.  Icing and applying heat to the affected area.  Medicines that help: ? To relieve pain and swelling. ? To relax your muscles.  Injections of medicines that help to relieve pain, irritation, and inflammation around the sciatic nerve (steroids).  Surgery.  Follow these instructions at home: Medicines  Take over-the-counter and prescription medicines only as told by your health care provider.  Do not drive or operate heavy machinery while taking prescription pain medicine. Managing pain  If directed, apply ice to the affected area. ? Put ice in a plastic bag. ? Place a towel between your skin and  the bag. ? Leave the ice on for 20 minutes, 2-3 times a day.  After icing, apply heat to the affected area before you exercise or as often as told by your health care provider. Use the heat source that your health care provider recommends, such as a moist heat pack or a heating pad. ? Place a towel between your skin and the heat source. ? Leave the heat on for 20-30 minutes. ? Remove the heat if your skin  turns bright red. This is especially important if you are unable to feel pain, heat, or cold. You may have a greater risk of getting burned. Activity  Return to your normal activities as told by your health care provider. Ask your health care provider what activities are safe for you. ? Avoid activities that make your symptoms worse.  Take brief periods of rest throughout the day. Resting in a lying or standing position is usually better than sitting to rest. ? When you rest for longer periods, mix in some mild activity or stretching between periods of rest. This will help to prevent stiffness and pain. ? Avoid sitting for long periods of time without moving. Get up and move around at least one time each hour.  Exercise and stretch regularly, as told by your health care provider.  Do not lift anything that is heavier than 10 lb (4.5 kg) while you have symptoms of sciatica. When you do not have symptoms, you should still avoid heavy lifting, especially repetitive heavy lifting.  When you lift objects, always use proper lifting technique, which includes: ? Bending your knees. ? Keeping the load close to your body. ? Avoiding twisting. General instructions  Use good posture. ? Avoid leaning forward while sitting. ? Avoid hunching over while standing.  Maintain a healthy weight. Excess weight puts extra stress on your back and makes it difficult to maintain good posture.  Wear supportive, comfortable shoes. Avoid wearing high heels.  Avoid sleeping on a mattress that is too soft or too hard. A mattress that is firm enough to support your back when you sleep may help to reduce your pain.  Keep all follow-up visits as told by your health care provider. This is important. Contact a health care provider if:  You have pain that wakes you up when you are sleeping.  You have pain that gets worse when you lie down.  Your pain is worse than you have experienced in the past.  Your pain lasts  longer than 4 weeks.  You experience unexplained weight loss. Get help right away if:  You lose control of your bowel or bladder (incontinence).  You have: ? Weakness in your lower back, pelvis, buttocks, or legs that gets worse. ? Redness or swelling of your back. ? A burning sensation when you urinate. This information is not intended to replace advice given to you by your health care provider. Make sure you discuss any questions you have with your health care provider. Document Released: 12/22/2000 Document Revised: 06/03/2015 Document Reviewed: 09/06/2014 Elsevier Interactive Patient Education  2017 [place].   Back Exercises The following exercises strengthen the muscles that help to support the back. They also help to keep the lower back flexible. Doing these exercises can help to prevent back pain or lessen existing pain. If you have back pain or discomfort, try doing these exercises 2-3 times each day or as told by your health care provider. When the pain goes away, do them once each day, but  increase the number of times that you repeat the steps for each exercise (do more repetitions). If you do not have back pain or discomfort, do these exercises once each day or as told by your health care provider. Exercises Single Knee to Chest  Repeat these steps 3-5 times for each leg: 1. Lie on your back on a firm bed or the floor with your legs extended. 2. Bring one knee to your chest. Your other leg should stay extended and in contact with the floor. 3. Hold your knee in place by grabbing your knee or thigh. 4. Pull on your knee until you feel a gentle stretch in your lower back. 5. Hold the stretch for 10-30 seconds. 6. Slowly release and straighten your leg.  Pelvic Tilt  Repeat these steps 5-10 times: 1. Lie on your back on a firm bed or the floor with your legs extended. 2. Bend your knees so they are pointing toward the ceiling and your feet are flat on the  floor. 3. Tighten your lower abdominal muscles to press your lower back against the floor. This motion will tilt your pelvis so your tailbone points up toward the ceiling instead of pointing to your feet or the floor. 4. With gentle tension and even breathing, hold this position for 5-10 seconds.  Cat-Cow  Repeat these steps until your lower back becomes more flexible: 1. Get into a hands-and-knees position on a firm surface. Keep your hands under your shoulders, and keep your knees under your hips. You may place padding under your knees for comfort. 2. Let your head hang down, and point your tailbone toward the floor so your lower back becomes rounded like the back of a cat. 3. Hold this position for 5 seconds. 4. Slowly lift your head and point your tailbone up toward the ceiling so your back forms a sagging arch like the back of a cow. 5. Hold this position for 5 seconds.  Press-Ups  Repeat these steps 5-10 times: 1. Lie on your abdomen (face-down) on the floor. 2. Place your palms near your head, about shoulder-width apart. 3. While you keep your back as relaxed as possible and keep your hips on the floor, slowly straighten your arms to raise the top half of your body and lift your shoulders. Do not use your back muscles to raise your upper torso. You may adjust the placement of your hands to make yourself more comfortable. 4. Hold this position for 5 seconds while you keep your back relaxed. 5. Slowly return to lying flat on the floor.  Bridges  Repeat these steps 10 times: 1. Lie on your back on a firm surface. 2. Bend your knees so they are pointing toward the ceiling and your feet are flat on the floor. 3. Tighten your buttocks muscles and lift your buttocks off of the floor until your waist is at almost the same height as your knees. You should feel the muscles working in your buttocks and the back of your thighs. If you do not feel these muscles, slide your feet 1-2 inches  farther away from your buttocks. 4. Hold this position for 3-5 seconds. 5. Slowly lower your hips to the starting position, and allow your buttocks muscles to relax completely.  If this exercise is too easy, try doing it with your arms crossed over your chest. Abdominal Crunches  Repeat these steps 5-10 times: 1. Lie on your back on a firm bed or the floor with your legs extended. 2.  Bend your knees so they are pointing toward the ceiling and your feet are flat on the floor. 3. Cross your arms over your chest. 4. Tip your chin slightly toward your chest without bending your neck. 5. Tighten your abdominal muscles and slowly raise your trunk (torso) high enough to lift your shoulder blades a tiny bit off of the floor. Avoid raising your torso higher than that, because it can put too much stress on your low back and it does not help to strengthen your abdominal muscles. 6. Slowly return to your starting position.  Back Lifts Repeat these steps 5-10 times: 1. Lie on your abdomen (face-down) with your arms at your sides, and rest your forehead on the floor. 2. Tighten the muscles in your legs and your buttocks. 3. Slowly lift your chest off of the floor while you keep your hips pressed to the floor. Keep the back of your head in line with the curve in your back. Your eyes should be looking at the floor. 4. Hold this position for 3-5 seconds. 5. Slowly return to your starting position.  Contact a health care provider if:  Your back pain or discomfort gets much worse when you do an exercise.  Your back pain or discomfort does not lessen within 2 hours after you exercise. If you have any of these problems, stop doing these exercises right away. Do not do them again unless your health care provider says that you can. Get help right away if:  You develop sudden, severe back pain. If this happens, stop doing the exercises right away. Do not do them again unless your health care provider says  that you can. This information is not intended to replace advice given to you by your health care provider. Make sure you discuss any questions you have with your health care provider. Document Released: 02/05/2004 Document Revised: 05/07/2015 Document Reviewed: 02/21/2014 Elsevier Interactive Patient Education  2017 [employer].

## 2016-12-20 ENCOUNTER — Encounter: Payer: Self-pay | Admitting: Physician Assistant

## 2016-12-21 MED ORDER — CYCLOBENZAPRINE HCL 5 MG PO TABS: 2.5000 mg | ORAL_TABLET | Freq: Every day | ORAL | 0 refills | Status: DC

## 2017-02-08 ENCOUNTER — Ambulatory Visit (INDEPENDENT_AMBULATORY_CARE_PROVIDER_SITE_OTHER): Payer: Medicare HMO | Admitting: Family Medicine

## 2017-02-08 ENCOUNTER — Encounter: Payer: Self-pay | Admitting: Family Medicine

## 2017-02-08 VITALS — BP 142/80 | HR 84 | Temp 98.8°F | Resp 16 | Wt 165.4 lb

## 2017-02-08 DIAGNOSIS — J039 Acute tonsillitis, unspecified: Secondary | ICD-10-CM | POA: Diagnosis not present

## 2017-02-08 LAB — POCT RAPID STREP A (OFFICE): Rapid Strep A Screen: POSITIVE — AB

## 2017-02-08 MED ORDER — AMOXICILLIN 875 MG PO TABS: 875.0000 mg | ORAL_TABLET | Freq: Two times a day (BID) | ORAL | 0 refills | Status: DC

## 2017-02-08 NOTE — Patient Instructions (Signed)
Discussed use of Mucinex D and salt water gargles.

## 2017-02-08 NOTE — Progress Notes (Signed)
Subjective:     Patient ID: Katie Bass, female   DOB: 06/08/1950, 67 y.o.   MRN: [id number] Chief Complaint  Patient presents with  . Cough    Patient comes in office toay with conerns of cough and congestion for the past 5 days . Patient states that che has had chills/sweats and sinus drainage in the past 24hrs, patient reports seeing white patches on the right side of her tonsil. Patient has tried otc Mucinex for relief.    HPI States she is now developing a cough and her throat is sore again.  Review of Systems     Objective:   Physical Exam  Constitutional: She appears well-developed and well-nourished. No distress.  Ears: T.M's intact without inflammation Throat: mild tonsillar enlargement with erythema and ? exudate Neck: right anterior cervical tenderness Lungs: clear     Assessment:    1. Tonsillitis; strep + - POCT rapid strep A - amoxicillin (AMOXIL) 875 MG tablet; Take 1 tablet (875 mg total) by mouth 2 (two) times daily.  Dispense: 20 tablet; Refill: 0    Plan:    Continue Mucinex D and add salt water gargles.

## 2017-05-04 ENCOUNTER — Encounter: Payer: Self-pay | Admitting: Physician Assistant

## 2017-05-04 DIAGNOSIS — R42 Dizziness and giddiness: Secondary | ICD-10-CM

## 2017-05-04 DIAGNOSIS — Z9109 Other allergy status, other than to drugs and biological substances: Secondary | ICD-10-CM

## 2017-05-04 DIAGNOSIS — L259 Unspecified contact dermatitis, unspecified cause: Secondary | ICD-10-CM

## 2017-05-04 MED ORDER — TRIAMCINOLONE ACETONIDE 0.1 % EX CREA: 1.0000 "application " | TOPICAL_CREAM | Freq: Three times a day (TID) | CUTANEOUS | 0 refills | Status: DC

## 2017-05-04 MED ORDER — FLUTICASONE PROPIONATE 50 MCG/ACT NA SUSP: 2.0000 | Freq: Every day | NASAL | 11 refills | Status: DC

## 2017-05-04 MED ORDER — MECLIZINE HCL 25 MG PO TABS: 25.0000 mg | ORAL_TABLET | Freq: Three times a day (TID) | ORAL | 11 refills | Status: DC | PRN

## 2017-05-05 DIAGNOSIS — H04123 Dry eye syndrome of bilateral lacrimal glands: Secondary | ICD-10-CM | POA: Diagnosis not present

## 2017-05-05 DIAGNOSIS — H43813 Vitreous degeneration, bilateral: Secondary | ICD-10-CM | POA: Diagnosis not present

## 2017-05-05 DIAGNOSIS — H524 Presbyopia: Secondary | ICD-10-CM | POA: Diagnosis not present

## 2017-08-29 ENCOUNTER — Ambulatory Visit (INDEPENDENT_AMBULATORY_CARE_PROVIDER_SITE_OTHER): Payer: Medicare HMO | Admitting: Physician Assistant

## 2017-08-29 ENCOUNTER — Encounter: Payer: Self-pay | Admitting: Physician Assistant

## 2017-08-29 VITALS — BP 160/90 | HR 82 | Temp 98.6°F | Resp 16 | Wt 162.0 lb

## 2017-08-29 DIAGNOSIS — J40 Bronchitis, not specified as acute or chronic: Secondary | ICD-10-CM | POA: Diagnosis not present

## 2017-08-29 DIAGNOSIS — J0140 Acute pansinusitis, unspecified: Secondary | ICD-10-CM | POA: Diagnosis not present

## 2017-08-29 MED ORDER — PREDNISONE 20 MG PO TABS: 40.0000 mg | ORAL_TABLET | Freq: Every day | ORAL | 0 refills | Status: DC

## 2017-08-29 MED ORDER — BENZONATATE 200 MG PO CAPS: 200.0000 mg | ORAL_CAPSULE | Freq: Two times a day (BID) | ORAL | 0 refills | Status: DC | PRN

## 2017-08-29 MED ORDER — ALBUTEROL SULFATE HFA 108 (90 BASE) MCG/ACT IN AERS: 1.0000 | INHALATION_SPRAY | Freq: Four times a day (QID) | RESPIRATORY_TRACT | 0 refills | Status: DC | PRN

## 2017-08-29 MED ORDER — AMOXICILLIN-POT CLAVULANATE 875-125 MG PO TABS: 1.0000 | ORAL_TABLET | Freq: Two times a day (BID) | ORAL | 0 refills | Status: DC

## 2017-08-29 NOTE — Progress Notes (Signed)
,  Patient: Katie Bass Female    DOB: [birth year]   67 y.o.   MRN: [id number] Visit Date: 08/29/2017  Today's Provider: Mar Daring, PA-C   Chief Complaint  Patient presents with  . URI   Subjective:    URI   This is a new problem. The current episode started 1 to 4 weeks ago (Last Tuesday with sore throat on Wednesday). Associated symptoms include congestion, coughing, ear pain ("Left ear pain"), headaches, sinus pain and a sore throat ("left side only"). Pertinent negatives include no chest pain or wheezing. Treatments tried: Mucinex D. The treatment provided no relief.      Allergies  Allergen Reactions  . Sulfa Antibiotics Itching and Swelling     Current Outpatient Medications:  .  Ergocalciferol (VITAMIN D2) 2000 UNITS TABS, Take 1 tablet by mouth daily., Disp: , Rfl:  .  fluticasone (FLONASE) 50 MCG/ACT nasal spray, Place 2 sprays into both nostrils daily., Disp: 16 g, Rfl: 11 .  Multiple Vitamin tablet, , Disp: , Rfl:  .  Omega-3 Fatty Acids (FISH OIL) 1000 MG CAPS, Take by mouth., Disp: , Rfl:  .  amoxicillin (AMOXIL) 875 MG tablet, Take 1 tablet (875 mg total) by mouth 2 (two) times daily. (Patient not taking: Reported on 08/29/2017), Disp: 20 tablet, Rfl: 0 .  cyclobenzaprine (FLEXERIL) 5 MG tablet, Take 0.5-1 tablets (2.5-5 mg total) by mouth at bedtime. (Patient not taking: Reported on 08/29/2017), Disp: 30 tablet, Rfl: 0 .  Fexofenadine-Pseudoephedrine (ALLEGRA-D 24 HOUR PO), Take 1 tablet by mouth daily., Disp: , Rfl:  .  meclizine (ANTIVERT) 25 MG tablet, Take 1 tablet (25 mg total) by mouth 3 (three) times daily as needed for dizziness. (Patient not taking: Reported on 08/29/2017), Disp: 30 tablet, Rfl: 11 .  montelukast (SINGULAIR) 10 MG tablet, Take 1 tablet (10 mg total) by mouth at bedtime. (Patient not taking: Reported on 08/29/2017), Disp: 30 tablet, Rfl: 11 .  triamcinolone cream (KENALOG) 0.1 %, Apply 1 application topically 3 (three) times daily.  (Patient not taking: Reported on 08/29/2017), Disp: 30 g, Rfl: 0  Review of Systems  Constitutional: Positive for chills. Negative for fever.  HENT: Positive for congestion, ear pain ("Left ear pain"), postnasal drip, sinus pressure, sinus pain, sore throat ("left side only"), trouble swallowing and voice change.   Respiratory: Positive for cough. Negative for chest tightness, shortness of breath and wheezing.   Cardiovascular: Negative for chest pain, palpitations and leg swelling.  Neurological: Positive for headaches.    Social History   Tobacco Use  . Smoking status: Former Research scientist (life sciences)  . Smokeless tobacco: Never Used  . Tobacco comment: smoked <1pack if cigarettesper day, for 1 year in her 27s  Substance Use Topics  . Alcohol use: No   Objective:   BP (!) 160/90 (BP Location: Left Arm, Patient Position: Sitting, Cuff Size: Normal)   Pulse 82   Temp 98.6 F (37 C) (Oral)   Resp 16   Wt 162 lb (73.5 kg)   SpO2 97%   BMI 28.70 kg/m  Vitals:   08/29/17 1310  BP: (!) 160/90  Pulse: 82  Resp: 16  Temp: 98.6 F (37 C)  TempSrc: Oral  SpO2: 97%  Weight: 162 lb (73.5 kg)     Physical Exam  Constitutional: She appears well-developed and well-nourished. No distress.  HENT:  Head: Normocephalic and atraumatic.  Right Ear: Hearing, tympanic membrane, external ear and ear canal normal.  Left Ear: Hearing, tympanic  membrane, external ear and ear canal normal.  Nose: Right sinus exhibits maxillary sinus tenderness and frontal sinus tenderness. Left sinus exhibits maxillary sinus tenderness and frontal sinus tenderness.  Mouth/Throat: Uvula is midline, oropharynx is clear and moist and mucous membranes are normal. No oropharyngeal exudate.  Neck: Normal range of motion. Neck supple. No tracheal deviation present. No thyromegaly present.  Cardiovascular: Normal rate, regular rhythm and normal heart sounds. Exam reveals no gallop and no friction rub.  No murmur  heard. Pulmonary/Chest: Effort normal. No stridor. No respiratory distress. She has wheezes (fine expiratory throughout). She has no rales.  Lymphadenopathy:    She has no cervical adenopathy.  Skin: She is not diaphoretic.  Vitals reviewed.      Assessment & Plan:     1. Acute pansinusitis, recurrence not specified Worsening symptoms that have not responded to OTC medications. Will give augmentin as below. Continue allergy medications. Stay well hydrated and get plenty of rest. Call if no symptom improvement or if symptoms worsen. - amoxicillin-clavulanate (AUGMENTIN) 875-125 MG tablet; Take 1 tablet by mouth 2 (two) times daily.  Dispense: 20 tablet; Refill: 0 - benzonatate (TESSALON) 200 MG capsule; Take 1 capsule (200 mg total) by mouth 2 (two) times daily as needed for cough.  Dispense: 20 capsule; Refill: 0  2. Bronchitis - benzonatate (TESSALON) 200 MG capsule; Take 1 capsule (200 mg total) by mouth 2 (two) times daily as needed for cough.  Dispense: 20 capsule; Refill: 0 - predniSONE (DELTASONE) 20 MG tablet; Take 2 tablets (40 mg total) by mouth daily with breakfast.  Dispense: 6 tablet; Refill: 0 - albuterol (PROVENTIL HFA;VENTOLIN HFA) 108 (90 Base) MCG/ACT inhaler; Inhale 1-2 puffs into the lungs every 6 (six) hours as needed for wheezing or shortness of breath.  Dispense: 1 Inhaler; Refill: 0       Mar Daring, PA-C  [facility]

## 2017-08-29 NOTE — Patient Instructions (Signed)
Sinusitis, Adult Sinusitis is soreness and inflammation of your sinuses. Sinuses are hollow spaces in the bones around your face. They are located:  Around your eyes.  In the middle of your forehead.  Behind your nose.  In your cheekbones.  Your sinuses and nasal passages are lined with a stringy fluid (mucus). Mucus normally drains out of your sinuses. When your nasal tissues get inflamed or swollen, the mucus can get trapped or blocked so air cannot flow through your sinuses. This lets bacteria, viruses, and funguses grow, and that leads to infection. Follow these instructions at home: Medicines  Take, use, or apply over-the-counter and prescription medicines only as told by your doctor. These may include nasal sprays.  If you were prescribed an antibiotic medicine, take it as told by your doctor. Do not stop taking the antibiotic even if you start to feel better. Hydrate and Humidify  Drink enough water to keep your pee (urine) clear or pale yellow.  Use a cool mist humidifier to keep the humidity level in your home above 50%.  Breathe in steam for 10-15 minutes, 3-4 times a day or as told by your doctor. You can do this in the bathroom while a hot shower is running.  Try not to spend time in cool or dry air. Rest  Rest as much as possible.  Sleep with your head raised (elevated).  Make sure to get enough sleep each night. General instructions  Put a warm, moist washcloth on your face 3-4 times a day or as told by your doctor. This will help with discomfort.  Wash your hands often with soap and water. If there is no soap and water, use hand sanitizer.  Do not smoke. Avoid being around people who are smoking (secondhand smoke).  Keep all follow-up visits as told by your doctor. This is important. Contact a doctor if:  You have a fever.  Your symptoms get worse.  Your symptoms do not get better within 10 days. Get help right away if:  You have a very bad  headache.  You cannot stop throwing up (vomiting).  You have pain or swelling around your face or eyes.  You have trouble seeing.  You feel confused.  Your neck is stiff.  You have trouble breathing. This information is not intended to replace advice given to you by your health care provider. Make sure you discuss any questions you have with your health care provider. Document Released: 06/16/2007 Document Revised: 08/24/2015 Document Reviewed: 10/23/2014 Elsevier Interactive Patient Education  2018 Else[employer] Inc[employer] Acute Bronchitis, Adult Acute bronchitis is when air tubes (bronchi) in the lungs suddenly get swollen. The condition can make it hard to breathe. It can also cause these symptoms:  A cough.  Coughing up clear, yellow, or green mucus.  Wheezing.  Chest congestion.  Shortness of breath.  A fever.  Body aches.  Chills.  A sore throat.  Follow these instructions at home: Medicines  Take over-the-counter and prescription medicines only as told by your doctor.  If you were prescribed an antibiotic medicine, take it as told by your doctor. Do not stop taking the antibiotic even if you start to feel better. General instructions  Rest.  Drink enough fluids to keep your pee (urine) clear or pale yellow.  Avoid smoking and secondhand smoke. If you smoke and you need help quitting, ask your doctor. Quitting will help your lungs heal faster.  Use an inhaler, cool mist vaporizer, or humidifier as told by your   doctor.  Keep all follow-up visits as told by your doctor. This is important. How is this prevented? To lower your risk of getting this condition again:  Wash your hands often with soap and water. If you cannot use soap and water, use hand sanitizer.  Avoid contact with people who have cold symptoms.  Try not to touch your hands to your mouth, nose, or eyes.  Make sure to get the flu shot every year.  Contact a doctor if:  Your symptoms do not get  better in 2 weeks. Get help right away if:  You cough up blood.  You have chest pain.  You have very bad shortness of breath.  You become dehydrated.  You faint (pass out) or keep feeling like you are going to pass out.  You keep throwing up (vomiting).  You have a very bad headache.  Your fever or chills gets worse. This information is not intended to replace advice given to you by your health care provider. Make sure you discuss any questions you have with your health care provider. Document Released: 06/16/2007 Document Revised: 08/06/2015 Document Reviewed: 06/18/2015 Elsevier Interactive Patient Education  2018 Elsevier Inc.  

## 2017-09-22 ENCOUNTER — Telehealth: Payer: Self-pay | Admitting: Physician Assistant

## 2017-09-22 NOTE — Telephone Encounter (Signed)
I left a message asking the pt to call me at [phone] to schedule AWV w/ [facility]. VDM (DD) °

## 2017-10-19 ENCOUNTER — Ambulatory Visit (INDEPENDENT_AMBULATORY_CARE_PROVIDER_SITE_OTHER): Payer: Medicare HMO

## 2017-10-19 DIAGNOSIS — Z23 Encounter for immunization: Secondary | ICD-10-CM

## 2017-12-02 ENCOUNTER — Other Ambulatory Visit: Payer: Self-pay | Admitting: Physician Assistant

## 2017-12-02 ENCOUNTER — Ambulatory Visit (INDEPENDENT_AMBULATORY_CARE_PROVIDER_SITE_OTHER): Payer: Medicare HMO

## 2017-12-02 VITALS — BP 136/72 | HR 73 | Temp 98.3°F | Ht 63.0 in | Wt 162.8 lb

## 2017-12-02 DIAGNOSIS — Z1231 Encounter for screening mammogram for malignant neoplasm of breast: Secondary | ICD-10-CM

## 2017-12-02 DIAGNOSIS — Z23 Encounter for immunization: Secondary | ICD-10-CM | POA: Diagnosis not present

## 2017-12-02 DIAGNOSIS — Z0000 Encounter for general adult medical examination without abnormal findings: Secondary | ICD-10-CM

## 2017-12-02 NOTE — Patient Instructions (Addendum)
[name] , Thank you for taking time to come for your Medicare Wellness Visit. I appreciate your ongoing commitment to your health goals. Please review the following plan we discussed and let me know if I can assist you in the future.   Screening recommendations/referrals: Colonoscopy: Up to date, due 05/2023 Mammogram: Order on file, pt to complete before expiration on 12/25/17. Bone Density: Pt declines order today.  Recommended yearly ophthalmology/optometry visit for glaucoma screening and checkup Recommended yearly dental visit for hygiene and checkup  Vaccinations: Influenza vaccine: Up to date Pneumococcal vaccine: Prevnar 13 administered today. Pneumovax 23 due in 1 year. Tdap vaccine: Pt declines today.  Shingles vaccine: Pt declines today.     Advanced directives: Please bring a copy of your POA (Power of Attorney) and/or Living Will to your next appointment.   Conditions/risks identified: Recommend to exercise for 3 days a week for at least 30 minutes at a time.   Next appointment: 01/25/18 @ 10 AM with Fenton Malling.   Preventive Care 66 Years and Older, Female Preventive care refers to lifestyle choices and visits with your health care provider that can promote health and wellness. What does preventive care include?  A yearly physical exam. This is also called an annual well check.  Dental exams once or twice a year.  Routine eye exams. Ask your health care provider how often you should have your eyes checked.  Personal lifestyle choices, including:  Daily care of your teeth and gums.  Regular physical activity.  Eating a healthy diet.  Avoiding tobacco and drug use.  Limiting alcohol use.  Practicing safe sex.  Taking low-dose aspirin every day.  Taking vitamin and mineral supplements as recommended by your health care provider. What happens during an annual well check? The services and screenings done by your health care provider during your  annual well check will depend on your age, overall health, lifestyle risk factors, and family history of disease. Counseling  Your health care provider may ask you questions about your:  Alcohol use.  Tobacco use.  Drug use.  Emotional well-being.  Home and relationship well-being.  Sexual activity.  Eating habits.  History of falls.  Memory and ability to understand (cognition).  Work and work Statistician.  Reproductive health. Screening  You may have the following tests or measurements:  Height, weight, and BMI.  Blood pressure.  Lipid and cholesterol levels. These may be checked every 5 years, or more frequently if you are over 59 years old.  Skin check.  Lung cancer screening. You may have this screening every year starting at age 66 if you have a 30-pack-year history of smoking and currently smoke or have quit within the past 15 years.  Fecal occult blood test (FOBT) of the stool. You may have this test every year starting at age 66.  Flexible sigmoidoscopy or colonoscopy. You may have a sigmoidoscopy every 5 years or a colonoscopy every 10 years starting at age 66.  Hepatitis C blood test.  Hepatitis B blood test.  Sexually transmitted disease (STD) testing.  Diabetes screening. This is done by checking your blood sugar (glucose) after you have not eaten for a while (fasting). You may have this done every 1-3 years.  Bone density scan. This is done to screen for osteoporosis. You may have this done starting at age 66.  Mammogram. This may be done every 1-2 years. Talk to your health care provider about how often you should have regular mammograms. Talk with  your health care provider about your test results, treatment options, and if necessary, the need for more tests. Vaccines  Your health care provider may recommend certain vaccines, such as:  Influenza vaccine. This is recommended every year.  Tetanus, diphtheria, and acellular pertussis (Tdap, Td)  vaccine. You may need a Td booster every 10 years.  Zoster vaccine. You may need this after age 66.  Pneumococcal 13-valent conjugate (PCV13) vaccine. One dose is recommended after age 27.  Pneumococcal polysaccharide (PPSV23) vaccine. One dose is recommended after age 16. Talk to your health care provider about which screenings and vaccines you need and how often you need them. This information is not intended to replace advice given to you by your health care provider. Make sure you discuss any questions you have with your health care provider. Document Released: 01/24/2015 Document Revised: 09/17/2015 Document Reviewed: 10/29/2014 Elsevier Interactive Patient Education  2017 [place] Prevention in the Home Falls can cause injuries. They can happen to people of all ages. There are many things you can do to make your home safe and to help prevent falls. What can I do on the outside of my home?  Regularly fix the edges of walkways and driveways and fix any cracks.  Remove anything that might make you trip as you walk through a door, such as a raised step or threshold.  Trim any bushes or trees on the path to your home.  Use bright outdoor lighting.  Clear any walking paths of anything that might make someone trip, such as rocks or tools.  Regularly check to see if handrails are loose or broken. Make sure that both sides of any steps have handrails.  Any raised decks and porches should have guardrails on the edges.  Have any leaves, snow, or ice cleared regularly.  Use sand or salt on walking paths during winter.  Clean up any spills in your garage right away. This includes oil or grease spills. What can I do in the bathroom?  Use night lights.  Install grab bars by the toilet and in the tub and shower. Do not use towel bars as grab bars.  Use non-skid mats or decals in the tub or shower.  If you need to sit down in the shower, use a plastic, non-slip  stool.  Keep the floor dry. Clean up any water that spills on the floor as soon as it happens.  Remove soap buildup in the tub or shower regularly.  Attach bath mats securely with double-sided non-slip rug tape.  Do not have throw rugs and other things on the floor that can make you trip. What can I do in the bedroom?  Use night lights.  Make sure that you have a light by your bed that is easy to reach.  Do not use any sheets or blankets that are too big for your bed. They should not hang down onto the floor.  Have a firm chair that has side arms. You can use this for support while you get dressed.  Do not have throw rugs and other things on the floor that can make you trip. What can I do in the kitchen?  Clean up any spills right away.  Avoid walking on wet floors.  Keep items that you use a lot in easy-to-reach places.  If you need to reach something above you, use a strong step stool that has a grab bar.  Keep electrical cords out of the way.  Do not  use floor polish or wax that makes floors slippery. If you must use wax, use non-skid floor wax.  Do not have throw rugs and other things on the floor that can make you trip. What can I do with my stairs?  Do not leave any items on the stairs.  Make sure that there are handrails on both sides of the stairs and use them. Fix handrails that are broken or loose. Make sure that handrails are as long as the stairways.  Check any carpeting to make sure that it is firmly attached to the stairs. Fix any carpet that is loose or worn.  Avoid having throw rugs at the top or bottom of the stairs. If you do have throw rugs, attach them to the floor with carpet tape.  Make sure that you have a light switch at the top of the stairs and the bottom of the stairs. If you do not have them, ask someone to add them for you. What else can I do to help prevent falls?  Wear shoes that:  Do not have high heels.  Have rubber bottoms.  Are  comfortable and fit you well.  Are closed at the toe. Do not wear sandals.  If you use a stepladder:  Make sure that it is fully opened. Do not climb a closed stepladder.  Make sure that both sides of the stepladder are locked into place.  Ask someone to hold it for you, if possible.  Clearly mark and make sure that you can see:  Any grab bars or handrails.  First and last steps.  Where the edge of each step is.  Use tools that help you move around (mobility aids) if they are needed. These include:  Canes.  Walkers.  Scooters.  Crutches.  Turn on the lights when you go into a dark area. Replace any light bulbs as soon as they burn out.  Set up your furniture so you have a clear path. Avoid moving your furniture around.  If any of your floors are uneven, fix them.  If there are any pets around you, be aware of where they are.  Review your medicines with your doctor. Some medicines can make you feel dizzy. This can increase your chance of falling. Ask your doctor what other things that you can do to help prevent falls. This information is not intended to replace advice given to you by your health care provider. Make sure you discuss any questions you have with your health care provider. Document Released: 10/24/2008 Document Revised: 06/05/2015 Document Reviewed: 02/01/2014 Elsevier Interactive Patient Education  2017 [employer].

## 2017-12-02 NOTE — Progress Notes (Signed)
Subjective:   Katie Bass is a 67 y.o. female who presents for an Initial Medicare Annual Wellness Visit.  Review of Systems    N/A  Cardiac Risk Factors include: advanced age (>52men, >80 women)     Objective:    Today's Vitals   12/02/17 1236 12/02/17 1315  BP: (!) 152/68 136/72  Pulse: 73   Temp: 98.3 F (36.8 C)   TempSrc: Oral   Weight: 162 lb 12.8 oz (73.8 kg)   Height: 5\' 3"  (1.6 m)   PainSc: 0-No pain    Body mass index is 28.84 kg/m.  Advanced Directives 12/02/2017 12/29/2015  Does Patient Have a Medical Advance Directive? Yes Yes  Type of Paramedic of [place];Living will Living will  Copy of [place] in Chart? No - copy requested -    Current Medications (verified) Outpatient Encounter Medications as of 12/02/2017  Medication Sig  . albuterol (PROVENTIL HFA;VENTOLIN HFA) 108 (90 Base) MCG/ACT inhaler Inhale 1-2 puffs into the lungs every 6 (six) hours as needed for wheezing or shortness of breath.  . Ergocalciferol (VITAMIN D2) 2000 UNITS TABS Take 1 tablet by mouth daily.  . fluticasone (FLONASE) 50 MCG/ACT nasal spray Place 2 sprays into both nostrils daily.  . meclizine (ANTIVERT) 25 MG tablet Take 1 tablet (25 mg total) by mouth 3 (three) times daily as needed for dizziness.  . Multiple Vitamin tablet Take 1 tablet by mouth daily.   . Omega-3 Fatty Acids (FISH OIL) 1000 MG CAPS Take by mouth daily.   Marland Kitchen triamcinolone cream (KENALOG) 0.1 % Apply 1 application topically 3 (three) times daily. (Patient taking differently: Apply 1 application topically 3 (three) times daily. )  . amoxicillin-clavulanate (AUGMENTIN) 875-125 MG tablet Take 1 tablet by mouth 2 (two) times daily. (Patient not taking: Reported on 12/02/2017)  . benzonatate (TESSALON) 200 MG capsule Take 1 capsule (200 mg total) by mouth 2 (two) times daily as needed for cough. (Patient not taking: Reported on 12/02/2017)  . Fexofenadine-Pseudoephedrine  (ALLEGRA-D 24 HOUR PO) Take 1 tablet by mouth daily.  . montelukast (SINGULAIR) 10 MG tablet Take 1 tablet (10 mg total) by mouth at bedtime. (Patient not taking: Reported on 08/29/2017)  . predniSONE (DELTASONE) 20 MG tablet Take 2 tablets (40 mg total) by mouth daily with breakfast. (Patient not taking: Reported on 12/02/2017)   No facility-administered encounter medications on file as of 12/02/2017.     Allergies (verified) Sulfa antibiotics   History: Past Medical History:  Diagnosis Date  . Asthma   . Depression    Past Surgical History:  Procedure Laterality Date  . NASAL SINUS SURGERY  2004   Family History  Problem Relation Age of Onset  . Cancer Mother   . Breast cancer Sister 69   Social History   Socioeconomic History  . Marital status: Married    Spouse name: Not on file  . Number of children: 2  . Years of education: Not on file  . Highest education level: Some college, no degree  Occupational History  . 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 Financial resource strain: Not hard at all  . Food insecurity:    Worry: Never true    Inability: Never true  . Transportation needs:    Medical: No    Non-medical: No  Tobacco Use  . Smoking status: Former Research scientist (life sciences)  . Smokeless tobacco: Never Used  . Tobacco comment: smoked <1pack if cigarettesper day, for 1  year in her 21s  Substance and Sexual Activity  . Alcohol use: No  . Drug use: No  . Sexual activity: Not on file  Lifestyle  . Physical activity:    Days per week: 0 days    Minutes per session: 0 min  . Stress: Only a little  Relationships  . Social connections:    Talks on phone: Patient refused    Gets together: Patient refused    Attends religious service: Patient refused    Active member of club or organization: Patient refused    Attends meetings of clubs or organizations: Patient refused    Relationship status: Patient refused  Other Topics Concern  . Not on file  Social History  Narrative  . Not on file    Tobacco Counseling Counseling given: Not Answered Comment: smoked <1pack if cigarettesper day, for 1 year in her 70s   Clinical Intake:  Pre-visit preparation completed: Yes  Pain : No/denies pain Pain Score: 0-No pain     Nutritional Status: BMI 25 -29 Overweight Nutritional Risks: None Diabetes: No  How often do you need to have someone help you when you read instructions, pamphlets, or other written materials from your doctor or pharmacy?: 1 - Never  Interpreter Needed?: No  Information entered by :: [facility], LPN   Activities of Daily Living In your present state of health, do you have any difficulty performing the following activities: 12/02/2017  Hearing? Y  Comment Does not wear hearing aids.   Vision? N  Comment Wears eye glasses.   Difficulty concentrating or making decisions? N  Walking or climbing stairs? N  Dressing or bathing? N  Doing errands, shopping? N  Preparing Food and eating ? N  Using the Toilet? N  In the past six months, have you accidently leaked urine? N  Do you have problems with loss of bowel control? N  Managing your Medications? N  Managing your Finances? N  Housekeeping or managing your Housekeeping? N  Some recent data might be hidden     Immunizations and Health Maintenance Immunization History  Administered Date(s) Administered  . Influenza Whole 10/20/2009  . Influenza, High Dose Seasonal PF 10/18/2016, 10/19/2017  . Influenza,inj,Quad PF,6+ Mos 10/19/2012, 10/04/2013, 10/10/2014, 10/22/2015  . Pneumococcal Conjugate-13 12/02/2017   Health Maintenance Due  Topic Date Due  . TETANUS/TDAP  09/14/1970  . DEXA SCAN  09/13/2016  . MAMMOGRAM  11/03/2017    Patient Care Team: Rubye Beach as PCP - General (Physician Assistant)  Indicate any recent Medical Services you may have received from other than [facility] providers in the past year (date may be approximate).     Assessment:     This is a routine wellness examination for Katie Bass.  Hearing/Vision screen Vision Screening Comments: [facility].   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 Exercise 3x per week (30 min per time)     Recommend to exercise for 3 days a week for at least 30 minutes at a time.       Depression Screen PHQ 2/9 Scores 12/02/2017 12/02/2017  PHQ - 2 Score 0 0  PHQ- 9 Score 0 -    Fall Risk Fall Risk  12/02/2017 02/08/2017  Falls in the past year? 0 No  FALL RISK PREVENTION PERTAINING TO THE HOME:  Any stairs in or around the home WITH handrails? No  Home free of loose throw rugs in walkways, pet beds, electrical cords, etc? Yes  Adequate lighting in your home to reduce risk of falls? Yes   ASSISTIVE DEVICES UTILIZED TO PREVENT FALLS:  Life alert? No  Use of a cane, walker or w/c? No  Grab bars in the bathroom? No  Shower chair or bench in shower? No  Elevated toilet seat or a handicapped toilet? No    TIMED UP AND GO:  Was the test performed? No .    Cognitive Function: Declined today.         Screening Tests Health Maintenance  Topic Date Due  . TETANUS/TDAP  09/14/1970  . DEXA SCAN  09/13/2016  . MAMMOGRAM  11/03/2017  . PNA vac Low Risk Adult (2 of 2 - PPSV23) 12/03/2018  . COLONOSCOPY  06/08/2023  . INFLUENZA VACCINE  Completed  . Hepatitis C Screening  Completed    Qualifies for Shingles Vaccine? Yes . Due for Shingrix. Education has been provided regarding the importance of this vaccine. Pt has been advised to call insurance company to determine out of pocket expense. Advised may also receive vaccine at local pharmacy or Health Dept. Verbalized acceptance and understanding.  Tdap: Although this vaccine is not a covered service  during a Wellness Exam, does the patient still wish to receive this vaccine today?  No .  Education has been provided regarding the importance of this vaccine. Advised may receive this vaccine at local pharmacy or Health Dept. Aware to provide a copy of the vaccination record if obtained from local pharmacy or Health Dept. Verbalized acceptance and understanding.  Flu Vaccine: Up to date  Pneumococcal Vaccine: Due for Pneumococcal vaccine. Does the patient want to receive this vaccine today?  Yes .   Cancer Screenings:  Colorectal Screening: Completed 06/07/13. Repeat every 10 years.  Mammogram: Order on file. Pt to completed before expiration on 12/25/17.  Bone Density: Pt declines order today.   Lung Cancer Screening: (Low Dose CT Chest recommended if Age 77-80 years, 30 pack-year currently smoking OR have quit w/in 15years.) does not qualify.    Additional Screening:  Hepatitis C Screening: Up to date  Vision Screening: Recommended annual ophthalmology exams for early detection of glaucoma and other disorders of the eye.  Dental Screening: Recommended annual dental exams for proper oral hygiene  Community Resource Referral:  CRR required this visit?  No       Plan:  I have personally reviewed and addressed the Medicare Annual Wellness questionnaire and have noted the following in the patient's chart:  A. Medical and social history B. Use of alcohol, tobacco or illicit drugs  C. Current medications and supplements D. Functional ability and status E.  Nutritional status F.  Physical activity G. Advance directives H. List of other physicians I.  Hospitalizations, surgeries, and ER visits in previous 12 months J.  [place] such as hearing and vision if needed, cognitive and depression L. Referrals and appointments - none  In addition, I have reviewed and discussed with patient certain preventive protocols, quality metrics, and best practice recommendations.  A written personalized care plan for preventive services as well as general preventive health recommendations were provided to patient.  See attached scanned questionnaire for additional information.   Signed,  Fabio Neighbors, LPN Nurse Health Advisor   Nurse Recommendations: Pt declined the tetanus vaccine and DEXA  order today.

## 2017-12-05 ENCOUNTER — Ambulatory Visit (INDEPENDENT_AMBULATORY_CARE_PROVIDER_SITE_OTHER): Payer: Medicare HMO | Admitting: Physician Assistant

## 2017-12-05 ENCOUNTER — Encounter: Payer: Self-pay | Admitting: Physician Assistant

## 2017-12-05 VITALS — BP 124/82 | HR 70 | Temp 98.7°F | Wt 162.6 lb

## 2017-12-05 DIAGNOSIS — N390 Urinary tract infection, site not specified: Secondary | ICD-10-CM | POA: Diagnosis not present

## 2017-12-05 LAB — POCT URINALYSIS DIPSTICK
BILIRUBIN UA: NEGATIVE
Glucose, UA: NEGATIVE
Ketones, UA: NEGATIVE
Nitrite, UA: NEGATIVE
PH UA: 6.5 (ref 5.0–8.0)
Protein, UA: NEGATIVE
Spec Grav, UA: <=1.005 — AB (ref 1.010–1.025)
UROBILINOGEN UA: 0.2 U/dL

## 2017-12-05 MED ORDER — CEPHALEXIN 500 MG PO CAPS: 500.0000 mg | ORAL_CAPSULE | Freq: Two times a day (BID) | ORAL | 0 refills | Status: DC

## 2017-12-05 NOTE — Progress Notes (Signed)
Patient: Katie Bass Female    DOB: 11/08/1950   67 y.o.   MRN: [id number] Visit Date: 12/05/2017  Today's Provider: Mar Daring, PA-C   Chief Complaint  Patient presents with  . Urinary Tract Infection   Subjective:    Urinary Tract Infection Patient presents today for possible UTI since 12/03/2017 . Patient is having the following symptoms: vaginal pain and urgency to urinate. Patient denies another symptoms at this time. Patient has been taking AZO cranberry urinary tract health.     Allergies  Allergen Reactions  . Sulfa Antibiotics Itching and Swelling     Current Outpatient Medications:  .  albuterol (PROVENTIL HFA;VENTOLIN HFA) 108 (90 Base) MCG/ACT inhaler, Inhale 1-2 puffs into the lungs every 6 (six) hours as needed for wheezing or shortness of breath., Disp: 1 Inhaler, Rfl: 0 .  benzonatate (TESSALON) 200 MG capsule, Take 1 capsule (200 mg total) by mouth 2 (two) times daily as needed for cough., Disp: 20 capsule, Rfl: 0 .  Ergocalciferol (VITAMIN D2) 2000 UNITS TABS, Take 1 tablet by mouth daily., Disp: , Rfl:  .  Fexofenadine-Pseudoephedrine (ALLEGRA-D 24 HOUR PO), Take 1 tablet by mouth daily., Disp: , Rfl:  .  fluticasone (FLONASE) 50 MCG/ACT nasal spray, Place 2 sprays into both nostrils daily., Disp: 16 g, Rfl: 11 .  meclizine (ANTIVERT) 25 MG tablet, Take 1 tablet (25 mg total) by mouth 3 (three) times daily as needed for dizziness., Disp: 30 tablet, Rfl: 11 .  montelukast (SINGULAIR) 10 MG tablet, Take 1 tablet (10 mg total) by mouth at bedtime., Disp: 30 tablet, Rfl: 11 .  Multiple Vitamin tablet, Take 1 tablet by mouth daily. , Disp: , Rfl:  .  Omega-3 Fatty Acids (FISH OIL) 1000 MG CAPS, Take by mouth daily. , Disp: , Rfl:  .  predniSONE (DELTASONE) 20 MG tablet, Take 2 tablets (40 mg total) by mouth daily with breakfast., Disp: 6 tablet, Rfl: 0 .  triamcinolone cream (KENALOG) 0.1 %, Apply 1 application topically 3 (three) times daily.  (Patient taking differently: Apply 1 application topically 3 (three) times daily. ), Disp: 30 g, Rfl: 0  Review of Systems  Constitutional: Negative.   HENT: Negative.   Respiratory: Negative.   Cardiovascular: Negative.   Gastrointestinal: Negative.   Genitourinary: Positive for dysuria, enuresis, frequency, urgency and vaginal pain. Negative for flank pain, hematuria and pelvic pain.    Social History   Tobacco Use  . Smoking status: Former Research scientist (life sciences)  . Smokeless tobacco: Never Used  . Tobacco comment: smoked <1pack if cigarettesper day, for 1 year in her 70s  Substance Use Topics  . Alcohol use: No   Objective:   BP 124/82 (BP Location: Left Arm, Patient Position: Sitting, Cuff Size: Normal)   Pulse 70   Temp 98.7 F (37.1 C) (Oral)   Wt 162 lb 9.6 oz (73.8 kg)   SpO2 96%   BMI 28.80 kg/m  Vitals:   12/05/17 1038  BP: 124/82  Pulse: 70  Temp: 98.7 F (37.1 C)  TempSrc: Oral  SpO2: 96%  Weight: 162 lb 9.6 oz (73.8 kg)     Physical Exam  Constitutional: She is oriented to person, place, and time. She appears well-developed and well-nourished. No distress.  Cardiovascular: Normal rate, regular rhythm and normal heart sounds. Exam reveals no gallop and no friction rub.  No murmur heard. Pulmonary/Chest: Effort normal and breath sounds normal. No respiratory distress. She has no wheezes. She has  no rales.  Abdominal: Soft. Normal appearance and bowel sounds are normal. She exhibits no distension and no mass. There is no hepatosplenomegaly. There is tenderness in the suprapubic area. There is no rebound, no guarding and no CVA tenderness.  Neurological: She is alert and oriented to person, place, and time.  Skin: Skin is warm and dry. She is not diaphoretic.        Assessment & Plan:     1. Urinary tract infection in female Worsening symptoms. UA positive. Will treat empirically withKeflex. Continue AZO for spasm and dysuria. Continue to push fluids. Urine sent for  culture. Will follow up pending C&S results. She is to call if symptoms do not improve or if they worsen.  - POCT urinalysis dipstick - Urine Culture - cephALEXin (KEFLEX) 500 MG capsule; Take 1 capsule (500 mg total) by mouth 2 (two) times daily.  Dispense: 14 capsule; Refill: 0       Mar Daring, PA-C  [facility]

## 2017-12-05 NOTE — Patient Instructions (Signed)

## 2017-12-07 ENCOUNTER — Telehealth: Payer: Self-pay

## 2017-12-07 ENCOUNTER — Ambulatory Visit
Admission: RE | Admit: 2017-12-07 | Discharge: 2017-12-07 | Disposition: A | Discharge status: Home / Self Care | Payer: Medicare HMO | Source: Ambulatory Visit | Attending: Physician Assistant | Admitting: Physician Assistant

## 2017-12-07 DIAGNOSIS — Z1231 Encounter for screening mammogram for malignant neoplasm of breast: Secondary | ICD-10-CM | POA: Diagnosis not present

## 2017-12-07 NOTE — Telephone Encounter (Signed)
-----   Message from Mar Daring, [place] sent at 12/07/2017 12:45 PM EST ----- Normal mammogram. Repeat screening in one year.

## 2017-12-07 NOTE — Telephone Encounter (Signed)
Patient was advised.  

## 2017-12-09 LAB — URINE CULTURE

## 2018-01-24 NOTE — Progress Notes (Addendum)
Patient: Katie Bass, Female    DOB: 01-Mar-1950, 68 y.o.   MRN: [id number] Visit Date: 01/25/2018  Today's Provider: Mar Daring, PA-C   Chief Complaint  Patient presents with  . Annual Exam   Subjective:   Patient saw [facility] for AWV on 12/02/2017.   Complete Physical Katie Bass is a 68 y.o. female. She feels fairly well. She reports exercising none. She reports she is sleeping fairly well. -----------------------------------------------------------   Review of Systems  Constitutional: Negative.   HENT: Positive for sinus pressure.   Eyes: Negative.   Respiratory: Negative.   Cardiovascular: Negative.   Gastrointestinal: Negative.   Endocrine: Negative.   Genitourinary: Negative.   Musculoskeletal: Negative.   Skin: Negative.   Allergic/Immunologic: Positive for environmental allergies.  Neurological: Negative.   Hematological: Negative.   Psychiatric/Behavioral: Negative.     Social History   Socioeconomic History  . Marital status: Married    Spouse name: Not on file  . Number of children: 2  . Years of education: Not on file  . Highest education level: Some college, no degree  Occupational History  . 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 Financial resource strain: Not hard at all  . Food insecurity:    Worry: Never true    Inability: Never true  . Transportation needs:    Medical: No    Non-medical: No  Tobacco Use  . Smoking status: Former Research scientist (life sciences)  . Smokeless tobacco: Never Used  . Tobacco comment: smoked <1pack if cigarettesper day, for 1 year in her 72s  Substance and Sexual Activity  . Alcohol use: No  . Drug use: No  . Sexual activity: Not on file  Lifestyle  . Physical activity:    Days per week: 0 days    Minutes per session: 0 min  . Stress: Only a little  Relationships  . Social connections:    Talks on phone: Patient refused    Gets together: Patient refused    Attends religious service: Patient  refused    Active member of club or organization: Patient refused    Attends meetings of clubs or organizations: Patient refused    Relationship status: Patient refused  . Intimate partner violence:    Fear of current or ex partner: Patient refused    Emotionally abused: Patient refused    Physically abused: Patient refused    Forced sexual activity: Patient refused  Other Topics Concern  . Not on file  Social History Narrative  . Not on file    Past Medical History:  Diagnosis Date  . Asthma   . Depression      Patient Active Problem List   Diagnosis Date Noted  . Hypercholesterolemia 01/25/2018  . Extrinsic asthma with exacerbation 10/29/2014  . Clinical depression 10/29/2014  . Disease of pharynx 10/29/2014  . Positional vertigo 10/29/2014  . Fibroid 10/29/2014  . Avitaminosis D 10/29/2014  . Allergic rhinitis 08/01/2008  . Dermatitis seborrheica 08/01/2008    Past Surgical History:  Procedure Laterality Date  . NASAL SINUS SURGERY  2004    Her family history includes Breast cancer (age of onset: 58) in her sister; Cancer in her mother.      Current Outpatient Medications:  .  albuterol (PROVENTIL HFA;VENTOLIN HFA) 108 (90 Base) MCG/ACT inhaler, Inhale 1-2 puffs into the lungs every 6 (six) hours as needed for wheezing or shortness of breath., Disp: 1 Inhaler, Rfl: 0 .  Ergocalciferol (VITAMIN D2) 2000  UNITS TABS, Take 1 tablet by mouth daily., Disp: , Rfl:  .  Fexofenadine-Pseudoephedrine (ALLEGRA-D 24 HOUR PO), Take 1 tablet by mouth daily., Disp: , Rfl:  .  fluticasone (FLONASE) 50 MCG/ACT nasal spray, Place 2 sprays into both nostrils daily., Disp: 16 g, Rfl: 11 .  meclizine (ANTIVERT) 25 MG tablet, Take 1 tablet (25 mg total) by mouth 3 (three) times daily as needed for dizziness., Disp: 30 tablet, Rfl: 11 .  montelukast (SINGULAIR) 10 MG tablet, Take 1 tablet (10 mg total) by mouth at bedtime., Disp: 30 tablet, Rfl: 11 .  Multiple Vitamin tablet, Take 1  tablet by mouth daily. , Disp: , Rfl:  .  Omega-3 Fatty Acids (FISH OIL) 1000 MG CAPS, Take by mouth daily. , Disp: , Rfl:  .  triamcinolone cream (KENALOG) 0.1 %, Apply 1 application topically 3 (three) times daily. (Patient taking differently: Apply 1 application topically 3 (three) times daily. ), Disp: 30 g, Rfl: 0  Patient Care Team: Rubye Beach as PCP - General (Physician Assistant)     Objective:   Vitals:   Physical Exam Vitals signs reviewed.  Constitutional:      General: She is not in acute distress.    Appearance: Normal appearance. She is well-developed and normal weight. She is not diaphoretic.  HENT:     Head: Normocephalic and atraumatic.     Right Ear: Tympanic membrane, ear canal and external ear normal.     Left Ear: Tympanic membrane, ear canal and external ear normal.     Nose: Nose normal.     Mouth/Throat:     Mouth: Mucous membranes are moist.     Pharynx: No oropharyngeal exudate.  Eyes:     General: No scleral icterus.       Right eye: No discharge.        Left eye: No discharge.     Conjunctiva/sclera: Conjunctivae normal.     Pupils: Pupils are equal, round, and reactive to light.  Neck:     Musculoskeletal: Normal range of motion and neck supple.     Thyroid: No thyromegaly.     Vascular: No carotid bruit or JVD.     Trachea: No tracheal deviation.  Cardiovascular:     Rate and Rhythm: Normal rate and regular rhythm.     Heart sounds: Normal heart sounds. No murmur. No friction rub. No gallop.   Pulmonary:     Effort: Pulmonary effort is normal. No respiratory distress.     Breath sounds: Normal breath sounds. No wheezing or rales.  Chest:     Chest wall: No tenderness.  Abdominal:     General: Bowel sounds are normal. There is no distension.     Palpations: Abdomen is soft. There is no mass.     Tenderness: There is no abdominal tenderness. There is no guarding or rebound.  Musculoskeletal: Normal range of motion.         General: No tenderness.  Lymphadenopathy:     Cervical: No cervical adenopathy.  Skin:    General: Skin is warm and dry.     Findings: No rash.  Neurological:     Mental Status: She is alert and oriented to person, place, and time.  Psychiatric:        Behavior: Behavior normal.        Thought Content: Thought content normal.        Judgment: Judgment normal.     Activities of Daily Living In  your present state of health, do you have any difficulty performing the following activities: 12/02/2017  Hearing? Y  Comment Does not wear hearing aids.   Vision? N  Comment Wears eye glasses.   Difficulty concentrating or making decisions? N  Walking or climbing stairs? N  Dressing or bathing? N  Doing errands, shopping? N  Preparing Food and eating ? N  Using the Toilet? N  In the past six months, have you accidently leaked urine? N  Do you have problems with loss of bowel control? N  Managing your Medications? N  Managing your Finances? N  Housekeeping or managing your Housekeeping? N  Some recent data might be hidden    Fall Risk Assessment Fall Risk  12/02/2017 02/08/2017  Falls in the past year? 0 No     Depression Screen PHQ 2/9 Scores 12/02/2017 12/02/2017  PHQ - 2 Score 0 0  PHQ- 9 Score 0 -    No flowsheet data found.    Assessment & Plan:    Annual Physical Reviewed patient's Family Medical History Reviewed and updated list of patient's medical providers Assessment of cognitive impairment was done Assessed patient's functional ability Established a written schedule for health screening [place] Completed and Reviewed  Exercise Activities and Dietary recommendations Goals    . Exercise 3x per week (30 min per time)     Recommend to exercise for 3 days a week for at least 30 minutes at a time.        Immunization History  Administered Date(s) Administered  . Influenza Whole 10/20/2009  . Influenza, High Dose Seasonal PF  10/18/2016, 10/19/2017  . Influenza,inj,Quad PF,6+ Mos 10/19/2012, 10/04/2013, 10/10/2014, 10/22/2015  . Pneumococcal Conjugate-13 12/02/2017    Health Maintenance  Topic Date Due  . TETANUS/TDAP  09/14/1970  . DEXA SCAN  09/13/2016  . PNA vac Low Risk Adult (2 of 2 - PPSV23) 12/03/2018  . MAMMOGRAM  12/08/2019  . COLONOSCOPY  06/08/2023  . INFLUENZA VACCINE  Completed  . Hepatitis C Screening  Completed     Discussed health benefits of physical activity, and encouraged her to engage in regular exercise appropriate for her age and condition.    1. Annual Physical Exam Normal exam. Up to date on vaccinations. Patient declines bone density.   2. Environmental allergies Stable. Diagnosis pulled for medication refill. Continue current medical treatment plan. - montelukast (SINGULAIR) 10 MG tablet; Take 1 tablet (10 mg total) by mouth at bedtime.  Dispense: 30 tablet; Refill: 11  3. Avitaminosis D Taking OTC supplements. Postmenopausal. Will check labs as below and f/u pending results. - CBC w/Diff/Platelet - Vitamin D (25 hydroxy)  4. Recurrent major depressive disorder, in full remission ([place]) PHQ9 was 0 today. Will check labs as below and f/u pending results. - TSH  5. Hypercholesterolemia Diet controlled. Patient declines medications at this time. Will check labs as below and f/u pending results. - CBC w/Diff/Platelet - Comprehensive Metabolic Panel (CMET) - Lipid Profile - HgB A1c  6. BMI 28.0-28.9,adult Counseled patient on healthy lifestyle modifications including dieting and exercise.  - Comprehensive Metabolic Panel (CMET) - Lipid Profile - HgB A1c  ------------------------------------------------------------------------------------------------------------    Mar Daring, PA-C  [facility]

## 2018-01-25 ENCOUNTER — Encounter: Payer: Self-pay | Admitting: Physician Assistant

## 2018-01-25 ENCOUNTER — Ambulatory Visit (INDEPENDENT_AMBULATORY_CARE_PROVIDER_SITE_OTHER): Payer: Medicare HMO | Admitting: Physician Assistant

## 2018-01-25 VITALS — BP 118/78 | HR 71 | Temp 97.8°F | Resp 16 | Ht 63.0 in | Wt 162.0 lb

## 2018-01-25 DIAGNOSIS — Z9109 Other allergy status, other than to drugs and biological substances: Secondary | ICD-10-CM

## 2018-01-25 DIAGNOSIS — E7800 Pure hypercholesterolemia, unspecified: Secondary | ICD-10-CM | POA: Diagnosis not present

## 2018-01-25 DIAGNOSIS — Z0000 Encounter for general adult medical examination without abnormal findings: Secondary | ICD-10-CM

## 2018-01-25 DIAGNOSIS — E559 Vitamin D deficiency, unspecified: Secondary | ICD-10-CM

## 2018-01-25 DIAGNOSIS — Z6828 Body mass index (BMI) 28.0-28.9, adult: Secondary | ICD-10-CM | POA: Insufficient documentation

## 2018-01-25 DIAGNOSIS — R69 Illness, unspecified: Secondary | ICD-10-CM | POA: Diagnosis not present

## 2018-01-25 DIAGNOSIS — F3342 Major depressive disorder, recurrent, in full remission: Secondary | ICD-10-CM

## 2018-01-25 MED ORDER — MONTELUKAST SODIUM 10 MG PO TABS: 10.0000 mg | ORAL_TABLET | Freq: Every day | ORAL | 11 refills | Status: DC

## 2018-01-25 NOTE — Patient Instructions (Signed)
Health Maintenance After Age 67 After age 67, you are at a higher risk for certain long-term diseases and infections as well as injuries from falls. Falls are a major cause of broken bones and head injuries in people who are older than age 67. Getting regular preventive care can help to keep you healthy and well. Preventive care includes getting regular testing and making lifestyle changes as recommended by your health care provider. Talk with your health care provider about:  Which screenings and tests you should have. A screening is a test that checks for a disease when you have no symptoms.  A diet and exercise plan that is right for you. What should I know about screenings and tests to prevent falls? Screening and testing are the best ways to find a health problem early. Early diagnosis and treatment give you the best chance of managing medical conditions that are common after age 67. Certain conditions and lifestyle choices may make you more likely to have a fall. Your health care provider may recommend:  Regular vision checks. Poor vision and conditions such as cataracts can make you more likely to have a fall. If you wear glasses, make sure to get your prescription updated if your vision changes.  Medicine review. Work with your health care provider to regularly review all of the medicines you are taking, including over-the-counter medicines. Ask your health care provider about any side effects that may make you more likely to have a fall. Tell your health care provider if any medicines that you take make you feel dizzy or sleepy.  Osteoporosis screening. Osteoporosis is a condition that causes the bones to get weaker. This can make the bones weak and cause them to break more easily.  Blood pressure screening. Blood pressure changes and medicines to control blood pressure can make you feel dizzy.  Strength and balance checks. Your health care provider may recommend certain tests to check your  strength and balance while standing, walking, or changing positions.  Foot health exam. Foot pain and numbness, as well as not wearing proper footwear, can make you more likely to have a fall.  Depression screening. You may be more likely to have a fall if you have a fear of falling, feel emotionally low, or feel unable to do activities that you used to do.  Alcohol use screening. Using too much alcohol can affect your balance and may make you more likely to have a fall. What actions can I take to lower my risk of falls? General instructions  Talk with your health care provider about your risks for falling. Tell your health care provider if: ? You fall. Be sure to tell your health care provider about all falls, even ones that seem minor. ? You feel dizzy, sleepy, or off-balance.  Take over-the-counter and prescription medicines only as told by your health care provider. These include any supplements.  Eat a healthy diet and maintain a healthy weight. A healthy diet includes low-fat dairy products, low-fat (lean) meats, and fiber from whole grains, beans, and lots of fruits and vegetables. Home safety  Remove any tripping hazards, such as rugs, cords, and clutter.  Install safety equipment such as grab bars in bathrooms and safety rails on stairs.  Keep rooms and walkways well-lit. Activity   Follow a regular exercise program to stay fit. This will help you maintain your balance. Ask your health care provider what types of exercise are appropriate for you.  If you need a cane or   walker, use it as recommended by your health care provider.  Wear supportive shoes that have nonskid soles. Lifestyle  Do not drink alcohol if your health care provider tells you not to drink.  If you drink alcohol, limit how much you have: ? 0-1 drink a day for women. ? 0-2 drinks a day for men.  Be aware of how much alcohol is in your drink. In the [place] one drink equals one typical bottle of beer (12  oz), one-half glass of wine (5 oz), or one shot of hard liquor (1 oz).  Do not use any products that contain nicotine or tobacco, such as cigarettes and e-cigarettes. If you need help quitting, ask your health care provider. Summary  Having a healthy lifestyle and getting preventive care can help to protect your health and wellness after age 67.  Screening and testing are the best way to find a health problem early and help you avoid having a fall. Early diagnosis and treatment give you the best chance for managing medical conditions that are more common for people who are older than age 67.  Falls are a major cause of broken bones and head injuries in people who are older than age 67. Take precautions to prevent a fall at home.  Work with your health care provider to learn what changes you can make to improve your health and wellness and to prevent falls. This information is not intended to replace advice given to you by your health care provider. Make sure you discuss any questions you have with your health care provider. Document Released: 11/10/2016 Document Revised: 11/10/2016 Document Reviewed: 11/10/2016 Elsevier Interactive Patient Education  2019 Elsevier Inc.  

## 2018-01-26 LAB — COMPREHENSIVE METABOLIC PANEL
ALK PHOS: 105 IU/L (ref 39–117)
ALT: 15 IU/L (ref 0–32)
AST: 18 IU/L (ref 0–40)
Albumin/Globulin Ratio: 2.1 (ref 1.2–2.2)
Albumin: 4.4 g/dL (ref 3.6–4.8)
BILIRUBIN TOTAL: 0.4 mg/dL (ref 0.0–1.2)
BUN/Creatinine Ratio: 15 (ref 12–28)
BUN: 10 mg/dL (ref 8–27)
CHLORIDE: 103 mmol/L (ref 96–106)
CO2: 23 mmol/L (ref 20–29)
CREATININE: 0.68 mg/dL (ref 0.57–1.00)
Calcium: 10 mg/dL (ref 8.7–10.3)
GFR calc Af Amer: 105 mL/min/{1.73_m2} (ref >59)
GFR calc non Af Amer: 91 mL/min/{1.73_m2} (ref >59)
Globulin, Total: 2.1 g/dL (ref 1.5–4.5)
Glucose: 90 mg/dL (ref 65–99)
Potassium: 4.4 mmol/L (ref 3.5–5.2)
Sodium: 142 mmol/L (ref 134–144)
Total Protein: 6.5 g/dL (ref 6.0–8.5)

## 2018-01-26 LAB — CBC WITH DIFFERENTIAL/PLATELET
BASOS ABS: 0 10*3/uL (ref 0.0–0.2)
Basos: 1 %
EOS (ABSOLUTE): 0.1 10*3/uL (ref 0.0–0.4)
Eos: 2 %
HEMOGLOBIN: 14.3 g/dL (ref 11.1–15.9)
Hematocrit: 43.1 % (ref 34.0–46.6)
IMMATURE GRANS (ABS): 0 10*3/uL (ref 0.0–0.1)
Immature Granulocytes: 0 %
LYMPHS ABS: 1.6 10*3/uL (ref 0.7–3.1)
LYMPHS: 30 %
MCH: 29.2 pg (ref 26.6–33.0)
MCHC: 33.2 g/dL (ref 31.5–35.7)
MCV: 88 fL (ref 79–97)
MONOCYTES: 7 %
Monocytes Absolute: 0.4 10*3/uL (ref 0.1–0.9)
NEUTROS ABS: 3.3 10*3/uL (ref 1.4–7.0)
Neutrophils: 60 %
PLATELETS: 237 10*3/uL (ref 150–450)
RBC: 4.89 x10E6/uL (ref 3.77–5.28)
RDW: 13.1 % (ref 11.7–15.4)
WBC: 5.4 10*3/uL (ref 3.4–10.8)

## 2018-01-26 LAB — HEMOGLOBIN A1C
Est. average glucose Bld gHb Est-mCnc: 111 mg/dL
Hgb A1c MFr Bld: 5.5 % (ref 4.8–5.6)

## 2018-01-26 LAB — LIPID PANEL
CHOLESTEROL TOTAL: 228 mg/dL — AB (ref 100–199)
Chol/HDL Ratio: 3.2 ratio (ref 0.0–4.4)
HDL: 72 mg/dL (ref >39)
LDL Calculated: 127 mg/dL — ABNORMAL HIGH (ref 0–99)
TRIGLYCERIDES: 143 mg/dL (ref 0–149)
VLDL CHOLESTEROL CAL: 29 mg/dL (ref 5–40)

## 2018-01-26 LAB — TSH: TSH: 0.951 u[IU]/mL (ref 0.450–4.500)

## 2018-01-26 LAB — VITAMIN D 25 HYDROXY (VIT D DEFICIENCY, FRACTURES): Vit D, 25-Hydroxy: 22.1 ng/mL — ABNORMAL LOW (ref 30.0–100.0)

## 2018-01-27 ENCOUNTER — Telehealth: Payer: Self-pay

## 2018-01-27 NOTE — Telephone Encounter (Signed)
[id number] 

## 2018-01-27 NOTE — Telephone Encounter (Signed)
-----   Message from Mar Daring, [place] sent at 01/27/2018  8:19 AM EST ----- Blood count is normal. Sugar is normal. Kidney and liver function normal. Thyroid is normal. Cholesterol slightly more elevated when compared to last year. Current risk factor of having a cardiovascular event over the next 10 years is moderate at 5.1%. Would recommend considering a cholesterol lowering medication. Also continue to work on lifestyle modifications with heart healthy dieting habits(low sodium, low fat, decrease red meat) and increasing physical activity as tolerated. Vit D is also low. Are you continuing to take Vit D 2000 IU daily at this time?

## 2018-01-30 NOTE — Telephone Encounter (Signed)
LMTCB 01/30/2018  Thanks,   -Camille Thau  

## 2018-01-30 NOTE — Telephone Encounter (Signed)
Pt returned missed call. Please call pt back on cell - [phone] (M)  Thanks, [place]

## 2018-01-30 NOTE — Telephone Encounter (Signed)
LMOVM for pt to return call 

## 2018-02-06 NOTE — Telephone Encounter (Signed)
Viewed by [name] on 01/27/2018 11:05 AM

## 2018-02-07 NOTE — Telephone Encounter (Signed)
Patient reports she does not want to start medication for cholesterol. Patient reports that she will try diet and exercise first. Patient reports she is taking OTC vitamin d daily. sd

## 2018-02-10 NOTE — Addendum Note (Signed)
Addended by: Mar Daring on: 02/10/2018 07:58 AM   Modules accepted: Level of Service

## 2018-05-08 ENCOUNTER — Other Ambulatory Visit: Payer: Self-pay | Admitting: Physician Assistant

## 2018-05-08 DIAGNOSIS — Z9109 Other allergy status, other than to drugs and biological substances: Secondary | ICD-10-CM

## 2018-08-28 ENCOUNTER — Telehealth: Payer: Self-pay | Admitting: Physician Assistant

## 2018-08-28 NOTE — Telephone Encounter (Signed)
Sinus drianage issues - ears clogged  Rash - bumps on navel - severely itchy  Pt was offered a virtural visit - really wants an in-office visit.  Please call pt back at [phone].  Thanks, [employer]

## 2018-08-28 NOTE — Telephone Encounter (Signed)
Scheduled patient for a telephone visit tomorrow.

## 2018-08-28 NOTE — Telephone Encounter (Signed)
Please Review

## 2018-08-28 NOTE — Telephone Encounter (Signed)
It may be best to start with a video visit due to her symptoms.

## 2018-08-29 ENCOUNTER — Other Ambulatory Visit: Payer: Self-pay

## 2018-08-29 ENCOUNTER — Ambulatory Visit (INDEPENDENT_AMBULATORY_CARE_PROVIDER_SITE_OTHER): Payer: Medicare HMO | Admitting: Physician Assistant

## 2018-08-29 ENCOUNTER — Encounter: Payer: Self-pay | Admitting: Physician Assistant

## 2018-08-29 DIAGNOSIS — J0140 Acute pansinusitis, unspecified: Secondary | ICD-10-CM | POA: Diagnosis not present

## 2018-08-29 DIAGNOSIS — B372 Candidiasis of skin and nail: Secondary | ICD-10-CM | POA: Diagnosis not present

## 2018-08-29 MED ORDER — PREDNISONE 20 MG PO TABS: 20.0000 mg | ORAL_TABLET | Freq: Every day | ORAL | 0 refills | Status: DC

## 2018-08-29 MED ORDER — AMOXICILLIN-POT CLAVULANATE 875-125 MG PO TABS: 1.0000 | ORAL_TABLET | Freq: Two times a day (BID) | ORAL | 0 refills | Status: DC

## 2018-08-29 MED ORDER — FLUCONAZOLE 150 MG PO TABS: 150.0000 mg | ORAL_TABLET | Freq: Once | ORAL | 0 refills | Status: AC

## 2018-08-29 NOTE — Progress Notes (Signed)
Patient: Katie Bass Female    DOB: 1950-06-11   68 y.o.   MRN: [id number] Visit Date: 08/29/2018  Today's Provider: Mar Daring, PA-C   No chief complaint on file.  Subjective:     Virtual Visit via Telephone Note  I connected with Katie Bass on 08/29/18 at  2:40 PM EDT by telephone and verified that I am speaking with the correct person using two identifiers.  Location: Patient: Home Provider: [facility]   I discussed the limitations, risks, security and privacy concerns of performing an evaluation and management service by telephone and the availability of in person appointments. I also discussed with the patient that there may be a patient responsible charge related to this service. The patient expressed understanding and agreed to proceed.  HPI  Patient c/o sinus symptoms and ear clogged. Reports symptoms started approximately 2 weeks ago and have been progressively worsening. She has tried her allegra D, mucinex and Singulair. She reports yesterday was her worst day thus far with sinus congestion, post nasal drainage, ear fullness sensation causing vertigo. Finally she started Mucinex and that has helped some. She denies fever or cough/SOB. Mainly head pressure and headaches are her worst symptoms. She denies any exposure to Covid and reports her and her husband are isolating at home and have only been to doctors appointments.   She also c/o rash in and around her navel. Reports that is itching a lot. Has been present x 4 weeks. She has tried topical hydrocortisone cream and an OTC anti-fungal. Reports no improvement, however, she also has not used either long term or consistently. She also admits to working outside in her yard a lot and it has been very hot and humid.   Allergies  Allergen Reactions   Sulfa Antibiotics Itching and Swelling     Current Outpatient Medications:    albuterol (PROVENTIL HFA;VENTOLIN HFA) 108 (90 Base) MCG/ACT inhaler, Inhale 1-2 puffs  into the lungs every 6 (six) hours as needed for wheezing or shortness of breath., Disp: 1 Inhaler, Rfl: 0   Ergocalciferol (VITAMIN D2) 2000 UNITS TABS, Take 1 tablet by mouth daily., Disp: , Rfl:    Fexofenadine-Pseudoephedrine (ALLEGRA-D 24 HOUR PO), Take 1 tablet by mouth daily., Disp: , Rfl:    fluticasone (FLONASE) 50 MCG/ACT nasal spray, Use 2 spray(s) in each nostril once daily, Disp: 48 g, Rfl: 0   meclizine (ANTIVERT) 25 MG tablet, Take 1 tablet (25 mg total) by mouth 3 (three) times daily as needed for dizziness., Disp: 30 tablet, Rfl: 11   montelukast (SINGULAIR) 10 MG tablet, Take 1 tablet (10 mg total) by mouth at bedtime., Disp: 30 tablet, Rfl: 11   Multiple Vitamin tablet, Take 1 tablet by mouth daily. , Disp: , Rfl:    Omega-3 Fatty Acids (FISH OIL) 1000 MG CAPS, Take by mouth daily. , Disp: , Rfl:    triamcinolone cream (KENALOG) 0.1 %, Apply 1 application topically 3 (three) times daily. (Patient taking differently: Apply 1 application topically 3 (three) times daily. ), Disp: 30 g, Rfl: 0  Review of Systems  Constitutional: Positive for fatigue. Negative for chills and fever.  HENT: Positive for congestion, ear pain (fullness), postnasal drip, sinus pressure, sinus pain and sneezing. Negative for ear discharge, hearing loss, sore throat, tinnitus and trouble swallowing.   Respiratory: Positive for cough. Negative for chest tightness, shortness of breath and wheezing.   Cardiovascular: Negative for chest pain, palpitations and leg swelling.  Gastrointestinal: Negative  for abdominal pain, nausea and vomiting.  Neurological: Positive for dizziness and headaches. Negative for light-headedness.    Social History   Tobacco Use   Smoking status: Former Smoker   Smokeless tobacco: Never Used   Tobacco comment: smoked <1pack if cigarettesper day, for 1 year in her 47s  Substance Use Topics   Alcohol use: No      Objective:   There were no vitals taken for this  visit. There were no vitals filed for this visit.   Physical Exam Vitals signs reviewed.  Constitutional:      General: She is not in acute distress. HENT:     Head:     Comments: Patient reports sinus tenderness on her cheeks and her forehead when palpated Pulmonary:     Effort: No respiratory distress.  Neurological:     Mental Status: She is alert.      No results found for any visits on 08/29/18.     Assessment & Plan     1. Acute non-recurrent pansinusitis Worsening symptoms that have not responded to OTC medications. Will give augmentin and prednisone as below. Continue allergy medications. Stay well hydrated and get plenty of rest. Call if no symptom improvement or if symptoms worsen. - amoxicillin-clavulanate (AUGMENTIN) 875-125 MG tablet; Take 1 tablet by mouth 2 (two) times daily.  Dispense: 20 tablet; Refill: 0 - predniSONE (DELTASONE) 20 MG tablet; Take 1 tablet (20 mg total) by mouth daily with breakfast.  Dispense: 5 tablet; Refill: 0  2. Yeast dermatitis Suspect yeast of the navel since she has been more hot and sweaty recently. Will try diflucan as below to see if that will calm down current infection. Call if not improving.  - fluconazole (DIFLUCAN) 150 MG tablet; Take 1 tablet (150 mg total) by mouth once for 1 dose. May repeat in 48-72 hrs if needed  Dispense: 2 tablet; Refill: 0   I discussed the assessment and treatment plan with the patient. The patient was provided an opportunity to ask questions and all were answered. The patient agreed with the plan and demonstrated an understanding of the instructions.   The patient was advised to call back or seek an in-person evaluation if the symptoms worsen or if the condition fails to improve as anticipated.  I provided 14 minutes of non-face-to-face time during this encounter.    Mar Daring, PA-C  [facility]

## 2018-10-06 ENCOUNTER — Other Ambulatory Visit: Payer: Self-pay

## 2018-10-06 ENCOUNTER — Ambulatory Visit (INDEPENDENT_AMBULATORY_CARE_PROVIDER_SITE_OTHER): Payer: Medicare HMO | Admitting: Family Medicine

## 2018-10-06 DIAGNOSIS — Z23 Encounter for immunization: Secondary | ICD-10-CM | POA: Diagnosis not present

## 2018-11-05 ENCOUNTER — Encounter: Payer: Self-pay | Admitting: Physician Assistant

## 2018-11-05 DIAGNOSIS — J40 Bronchitis, not specified as acute or chronic: Secondary | ICD-10-CM

## 2018-11-05 DIAGNOSIS — Z9109 Other allergy status, other than to drugs and biological substances: Secondary | ICD-10-CM

## 2018-11-06 MED ORDER — FLUTICASONE PROPIONATE 50 MCG/ACT NA SUSP: NASAL | 3 refills | Status: DC

## 2018-11-06 MED ORDER — ALBUTEROL SULFATE HFA 108 (90 BASE) MCG/ACT IN AERS: 1.0000 | INHALATION_SPRAY | Freq: Four times a day (QID) | RESPIRATORY_TRACT | 3 refills | Status: DC | PRN

## 2018-12-04 NOTE — Progress Notes (Signed)
Subjective:   Katie Bass is a 68 y.o. female who presents for Medicare Annual (Subsequent) preventive examination.    This visit is being conducted through telemedicine due to the COVID-19 pandemic. This patient has given me verbal consent via doximity to conduct this visit, patient states they are participating from their home address. Some vital signs may be absent or patient reported.    Patient identification: identified by name, DOB, and current address  Review of Systems:  N/A  Cardiac Risk Factors include: advanced age (>57men, >87 women);dyslipidemia     Objective:     Vitals: There were no vitals taken for this visit.  There is no height or weight on file to calculate BMI. Unable to obtain vitals due to visit being conducted via telephonically.   Advanced Directives 12/05/2018 12/02/2017 12/29/2015  Does Patient Have a Medical Advance Directive? No Yes Yes  Type of Advance Directive - [place];Living will Living will  Copy of [place] in Chart? - No - copy requested -  Would patient like information on creating a medical advance directive? Yes (ED - Information included in AVS) - -    Tobacco Social History   Tobacco Use  Smoking Status Former Smoker  Smokeless Tobacco Never Used  Tobacco Comment   smoked <1pack if cigarettesper day, for 1 year in her 58s     Counseling given: Not Answered Comment: smoked <1pack if cigarettesper day, for 1 year in her 51s   Clinical Intake:  Pre-visit preparation completed: Yes  Pain : No/denies pain Pain Score: 0-No pain     Nutritional Risks: None Diabetes: No  How often do you need to have someone help you when you read instructions, pamphlets, or other written materials from your doctor or pharmacy?: 1 - Never  Interpreter Needed?: No  Information entered by :: [facility], LPN  Past Medical History:  Diagnosis Date  . Asthma   . Depression    Past Surgical  History:  Procedure Laterality Date  . NASAL SINUS SURGERY  2004   Family History  Problem Relation Age of Onset  . Cancer Mother   . Breast cancer Sister 35   Social History   Socioeconomic History  . Marital status: Married    Spouse name: Not on file  . Number of children: 2  . Years of education: Not on file  . Highest education level: Some college, no degree  Occupational History  . 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 Financial resource strain: Not hard at all  . Food insecurity    Worry: Never true    Inability: Never true  . Transportation needs    Medical: No    Non-medical: No  Tobacco Use  . Smoking status: Former Research scientist (life sciences)  . Smokeless tobacco: Never Used  . Tobacco comment: smoked <1pack if cigarettesper day, for 1 year in her 47s  Substance and Sexual Activity  . Alcohol use: No  . Drug use: No  . Sexual activity: Not on file  Lifestyle  . Physical activity    Days per week: 0 days    Minutes per session: 0 min  . Stress: Only a little  Relationships  . Social Herbalist on phone: Patient refused    Gets together: Patient refused    Attends religious service: Patient refused    Active member of club or organization: Patient refused    Attends meetings of clubs or organizations: Patient  refused    Relationship status: Patient refused  Other Topics Concern  . Not on file  Social History Narrative  . Not on file    Outpatient Encounter Medications as of 12/05/2018  Medication Sig  . albuterol (VENTOLIN HFA) 108 (90 Base) MCG/ACT inhaler Inhale 1-2 puffs into the lungs every 6 (six) hours as needed for wheezing or shortness of breath.  . Ergocalciferol (VITAMIN D2) 2000 UNITS TABS Take 1 tablet by mouth daily.  Marland Kitchen Fexofenadine-Pseudoephedrine (ALLEGRA-D 24 HOUR PO) Take 1 tablet by mouth daily.  . fluticasone (FLONASE) 50 MCG/ACT nasal spray Use 2 spray(s) in each nostril once daily  . meclizine (ANTIVERT) 25 MG tablet Take 1  tablet (25 mg total) by mouth 3 (three) times daily as needed for dizziness.  . Multiple Vitamin tablet Take 1 tablet by mouth daily.   . Omega-3 Fatty Acids (FISH OIL) 1000 MG CAPS Take by mouth daily.   Marland Kitchen triamcinolone cream (KENALOG) 0.1 % Apply 1 application topically 3 (three) times daily. (Patient taking differently: Apply 1 application topically 3 (three) times daily. )  . amoxicillin-clavulanate (AUGMENTIN) 875-125 MG tablet Take 1 tablet by mouth 2 (two) times daily. (Patient not taking: Reported on 12/05/2018)  . montelukast (SINGULAIR) 10 MG tablet Take 1 tablet (10 mg total) by mouth at bedtime. (Patient not taking: Reported on 12/05/2018)  . predniSONE (DELTASONE) 20 MG tablet Take 1 tablet (20 mg total) by mouth daily with breakfast. (Patient not taking: Reported on 12/05/2018)   No facility-administered encounter medications on file as of 12/05/2018.     Activities of Daily Living In your present state of health, do you have any difficulty performing the following activities: 12/05/2018  Hearing? Y  Comment Wears bilateral hearing aids.  Vision? N  Difficulty concentrating or making decisions? N  Walking or climbing stairs? N  Dressing or bathing? N  Doing errands, shopping? N  Preparing Food and eating ? N  Using the Toilet? N  In the past six months, have you accidently leaked urine? N  Do you have problems with loss of bowel control? N  Managing your Medications? N  Managing your Finances? N  Housekeeping or managing your Housekeeping? N  Some recent data might be hidden    Patient Care Team: Rubye Beach as PCP - General (Physician Assistant)    Assessment:   This is a routine wellness examination for Katie Bass.  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 Exercise 3x  per week (30 min per time)     Recommend to exercise for 3 days a week for at least 30 minutes at a time.        Fall Risk: Fall Risk  12/05/2018 12/02/2017 02/08/2017  Falls in the past year? 0 0 No  Number falls in past yr: 0 - -  Injury with Fall? 0 - -    FALL RISK PREVENTION PERTAINING TO THE HOME:  Any stairs in or around the home? Yes  If so, are there any without handrails? No   Home free of loose throw rugs in walkways, pet beds, electrical cords, etc? Yes  Adequate lighting in your home to reduce risk of falls? Yes   ASSISTIVE DEVICES UTILIZED TO PREVENT FALLS:  Life alert? No  Use of a cane, walker or w/c? No  Grab bars  in the bathroom? No  Shower chair or bench in shower? No  Elevated toilet seat or a handicapped toilet? No    TIMED UP AND GO:  Was the test performed? No .    Depression Screen PHQ 2/9 Scores 12/05/2018 12/02/2017 12/02/2017  PHQ - 2 Score 0 0 0  PHQ- 9 Score - 0 -     Cognitive Function: Declined today.         Immunization History  Administered Date(s) Administered  . Fluad Quad(high Dose 65+) 10/06/2018  . Influenza Whole 10/20/2009  . Influenza, High Dose Seasonal PF 10/18/2016, 10/19/2017  . Influenza,inj,Quad PF,6+ Mos 10/19/2012, 10/04/2013, 10/10/2014, 10/22/2015  . Pneumococcal Conjugate-13 12/02/2017    Qualifies for Shingles Vaccine? Yes . Due for Shingrix. Pt has been advised to call insurance company to determine out of pocket expense. Advised may also receive vaccine at local pharmacy or Health Dept. Verbalized acceptance and understanding.  Tdap: Although this vaccine is not a covered service during a Wellness Exam, does the patient still wish to receive this vaccine today?  No .   Flu Vaccine: Up to date  Pneumococcal Vaccine: Due for Pneumococcal vaccine. Does the patient want to receive this vaccine today?  No .   Screening Tests Health Maintenance  Topic Date Due  . DEXA SCAN  09/13/2016  . PNA vac Low  Risk Adult (2 of 2 - PPSV23) 12/03/2018  . TETANUS/TDAP  12/05/2019 (Originally 09/14/1970)  . MAMMOGRAM  12/08/2019  . COLONOSCOPY  06/08/2023  . INFLUENZA VACCINE  Completed  . Hepatitis C Screening  Completed    Cancer Screenings:  Colorectal Screening: Completed 06/07/13. Repeat every 10 years.  Mammogram: Completed 12/07/17.   Bone Density: Currently due. Ordered today. Pt provided with contact info and advised to call to schedule appt. Pt aware the office will call re: appt.  Lung Cancer Screening: (Low Dose CT Chest recommended if Age 86-80 years, 30 pack-year currently smoking OR have quit w/in 15years.) does not qualify.   Additional Screening:  Hepatitis C Screening: Up to date  Vision Screening: Recommended annual ophthalmology exams for early detection of glaucoma and other disorders of the eye.  Dental Screening: Recommended annual dental exams for proper oral hygiene  Community Resource Referral:  CRR required this visit?  No       Plan:  I have personally reviewed and addressed the Medicare Annual Wellness questionnaire and have noted the following in the patient's chart:  A. Medical and social history B. Use of alcohol, tobacco or illicit drugs  C. Current medications and supplements D. Functional ability and status E.  Nutritional status F.  Physical activity G. Advance directives H. List of other physicians I.  Hospitalizations, surgeries, and ER visits in previous 12 months J.  [place] such as hearing and vision if needed, cognitive and depression L. Referrals and appointments   In addition, I have reviewed and discussed with patient certain preventive protocols, quality metrics, and best practice recommendations. A written personalized care plan for preventive services as well as general preventive health recommendations were provided to patient. Nurse Health Advisor  Signed,    Tyresha Fede [place], [place]  [phone] Nurse Health Advisor    Nurse Notes: Pt to receive the Pneumovax 23 vaccine at next in office apt.

## 2018-12-05 ENCOUNTER — Ambulatory Visit (INDEPENDENT_AMBULATORY_CARE_PROVIDER_SITE_OTHER): Payer: Medicare HMO

## 2018-12-05 ENCOUNTER — Other Ambulatory Visit: Payer: Self-pay

## 2018-12-05 DIAGNOSIS — Z0000 Encounter for general adult medical examination without abnormal findings: Secondary | ICD-10-CM

## 2018-12-05 DIAGNOSIS — Z13820 Encounter for screening for osteoporosis: Secondary | ICD-10-CM | POA: Diagnosis not present

## 2018-12-05 NOTE — Patient Instructions (Addendum)
Katie Bass , Thank you for taking time to come for your Medicare Wellness Visit. I appreciate your ongoing commitment to your health goals. Please review the following plan we discussed and let me know if I can assist you in the future.   Screening recommendations/referrals: Colonoscopy: Up to date, due 05/2023 Mammogram: Up to date, due 11/2019 Bone Density: Ordered today. Pt provided with contact info and advised to call to schedule appt. Pt aware the office will call re: appt. Recommended yearly ophthalmology/optometry visit for glaucoma screening and checkup Recommended yearly dental visit for hygiene and checkup  Vaccinations: Influenza vaccine: Up to date Pneumococcal vaccine: Pneumovax 23 currently due. Tdap vaccine: Pt declines today.  Shingles vaccine: Pt declines today.     Advanced directives: Advance directive discussed with you today. Advised where to get form and to have them notarize. Requested a copy once completed.   Conditions/risks identified: Continue to work towards exercising 3 days a week for at least 30 minutes at a time.   Next appointment: Declined scheduling a follow up apt with PCP or an AWV for 2021 at this time.    Preventive Care 10 Years and Older, Female Preventive care refers to lifestyle choices and visits with your health care provider that can promote health and wellness. What does preventive care include?  A yearly physical exam. This is also called an annual well check.  Dental exams once or twice a year.  Routine eye exams. Ask your health care provider how often you should have your eyes checked.  Personal lifestyle choices, including:  Daily care of your teeth and gums.  Regular physical activity.  Eating a healthy diet.  Avoiding tobacco and drug use.  Limiting alcohol use.  Practicing safe sex.  Taking low-dose aspirin every day.  Taking vitamin and mineral supplements as recommended by your health care provider. What  happens during an annual well check? The services and screenings done by your health care provider during your annual well check will depend on your age, overall health, lifestyle risk factors, and family history of disease. Counseling  Your health care provider may ask you questions about your:  Alcohol use.  Tobacco use.  Drug use.  Emotional well-being.  Home and relationship well-being.  Sexual activity.  Eating habits.  History of falls.  Memory and ability to understand (cognition).  Work and work Statistician.  Reproductive health. Screening  You may have the following tests or measurements:  Height, weight, and BMI.  Blood pressure.  Lipid and cholesterol levels. These may be checked every 5 years, or more frequently if you are over 64 years old.  Skin check.  Lung cancer screening. You may have this screening every year starting at age 54 if you have a 30-pack-year history of smoking and currently smoke or have quit within the past 15 years.  Fecal occult blood test (FOBT) of the stool. You may have this test every year starting at age 39.  Flexible sigmoidoscopy or colonoscopy. You may have a sigmoidoscopy every 5 years or a colonoscopy every 10 years starting at age 19.  Hepatitis C blood test.  Hepatitis B blood test.  Sexually transmitted disease (STD) testing.  Diabetes screening. This is done by checking your blood sugar (glucose) after you have not eaten for a while (fasting). You may have this done every 1-3 years.  Bone density scan. This is done to screen for osteoporosis. You may have this done starting at age 78.  Mammogram. This may  be done every 1-2 years. Talk to your health care provider about how often you should have regular mammograms. Talk with your health care provider about your test results, treatment options, and if necessary, the need for more tests. Vaccines  Your health care provider may recommend certain vaccines, such as:   Influenza vaccine. This is recommended every year.  Tetanus, diphtheria, and acellular pertussis (Tdap, Td) vaccine. You may need a Td booster every 10 years.  Zoster vaccine. You may need this after age 66.  Pneumococcal 13-valent conjugate (PCV13) vaccine. One dose is recommended after age 74.  Pneumococcal polysaccharide (PPSV23) vaccine. One dose is recommended after age 8. Talk to your health care provider about which screenings and vaccines you need and how often you need them. This information is not intended to replace advice given to you by your health care provider. Make sure you discuss any questions you have with your health care provider. Document Released: 01/24/2015 Document Revised: 09/17/2015 Document Reviewed: 10/29/2014 Elsevier Interactive Patient Education  2017 [place] Prevention in the Home Falls can cause injuries. They can happen to people of all ages. There are many things you can do to make your home safe and to help prevent falls. What can I do on the outside of my home?  Regularly fix the edges of walkways and driveways and fix any cracks.  Remove anything that might make you trip as you walk through a door, such as a raised step or threshold.  Trim any bushes or trees on the path to your home.  Use bright outdoor lighting.  Clear any walking paths of anything that might make someone trip, such as rocks or tools.  Regularly check to see if handrails are loose or broken. Make sure that both sides of any steps have handrails.  Any raised decks and porches should have guardrails on the edges.  Have any leaves, snow, or ice cleared regularly.  Use sand or salt on walking paths during winter.  Clean up any spills in your garage right away. This includes oil or grease spills. What can I do in the bathroom?  Use night lights.  Install grab bars by the toilet and in the tub and shower. Do not use towel bars as grab bars.  Use non-skid mats  or decals in the tub or shower.  If you need to sit down in the shower, use a plastic, non-slip stool.  Keep the floor dry. Clean up any water that spills on the floor as soon as it happens.  Remove soap buildup in the tub or shower regularly.  Attach bath mats securely with double-sided non-slip rug tape.  Do not have throw rugs and other things on the floor that can make you trip. What can I do in the bedroom?  Use night lights.  Make sure that you have a light by your bed that is easy to reach.  Do not use any sheets or blankets that are too big for your bed. They should not hang down onto the floor.  Have a firm chair that has side arms. You can use this for support while you get dressed.  Do not have throw rugs and other things on the floor that can make you trip. What can I do in the kitchen?  Clean up any spills right away.  Avoid walking on wet floors.  Keep items that you use a lot in easy-to-reach places.  If you need to reach something above you,  use a strong step stool that has a grab bar.  Keep electrical cords out of the way.  Do not use floor polish or wax that makes floors slippery. If you must use wax, use non-skid floor wax.  Do not have throw rugs and other things on the floor that can make you trip. What can I do with my stairs?  Do not leave any items on the stairs.  Make sure that there are handrails on both sides of the stairs and use them. Fix handrails that are broken or loose. Make sure that handrails are as long as the stairways.  Check any carpeting to make sure that it is firmly attached to the stairs. Fix any carpet that is loose or worn.  Avoid having throw rugs at the top or bottom of the stairs. If you do have throw rugs, attach them to the floor with carpet tape.  Make sure that you have a light switch at the top of the stairs and the bottom of the stairs. If you do not have them, ask someone to add them for you. What else can I do to  help prevent falls?  Wear shoes that:  Do not have high heels.  Have rubber bottoms.  Are comfortable and fit you well.  Are closed at the toe. Do not wear sandals.  If you use a stepladder:  Make sure that it is fully opened. Do not climb a closed stepladder.  Make sure that both sides of the stepladder are locked into place.  Ask someone to hold it for you, if possible.  Clearly mark and make sure that you can see:  Any grab bars or handrails.  First and last steps.  Where the edge of each step is.  Use tools that help you move around (mobility aids) if they are needed. These include:  Canes.  Walkers.  Scooters.  Crutches.  Turn on the lights when you go into a dark area. Replace any light bulbs as soon as they burn out.  Set up your furniture so you have a clear path. Avoid moving your furniture around.  If any of your floors are uneven, fix them.  If there are any pets around you, be aware of where they are.  Review your medicines with your doctor. Some medicines can make you feel dizzy. This can increase your chance of falling. Ask your doctor what other things that you can do to help prevent falls. This information is not intended to replace advice given to you by your health care provider. Make sure you discuss any questions you have with your health care provider. Document Released: 10/24/2008 Document Revised: 06/05/2015 Document Reviewed: 02/01/2014 Elsevier Interactive Patient Education  2017 [employer].

## 2018-12-20 ENCOUNTER — Telehealth: Payer: Self-pay | Admitting: *Deleted

## 2018-12-20 ENCOUNTER — Other Ambulatory Visit: Payer: Self-pay | Admitting: *Deleted

## 2018-12-20 DIAGNOSIS — Z20822 Contact with and (suspected) exposure to covid-19: Secondary | ICD-10-CM

## 2018-12-21 LAB — NOVEL CORONAVIRUS, NAA: SARS-CoV-2, NAA: NOT DETECTED

## 2018-12-22 ENCOUNTER — Encounter: Payer: Self-pay | Admitting: Physician Assistant

## 2018-12-22 DIAGNOSIS — J0140 Acute pansinusitis, unspecified: Secondary | ICD-10-CM

## 2018-12-22 MED ORDER — AMOXICILLIN-POT CLAVULANATE 875-125 MG PO TABS: 1.0000 | ORAL_TABLET | Freq: Two times a day (BID) | ORAL | 0 refills | Status: DC

## 2019-01-07 ENCOUNTER — Encounter: Payer: Self-pay | Admitting: Physician Assistant

## 2019-01-10 NOTE — Telephone Encounter (Signed)
Error

## 2019-04-27 DIAGNOSIS — Z0100 Encounter for examination of eyes and vision without abnormal findings: Secondary | ICD-10-CM | POA: Diagnosis not present

## 2019-04-27 DIAGNOSIS — H524 Presbyopia: Secondary | ICD-10-CM | POA: Diagnosis not present

## 2019-09-18 ENCOUNTER — Other Ambulatory Visit: Payer: Self-pay

## 2019-09-18 ENCOUNTER — Ambulatory Visit (INDEPENDENT_AMBULATORY_CARE_PROVIDER_SITE_OTHER): Payer: Medicare HMO | Admitting: Family Medicine

## 2019-09-18 DIAGNOSIS — Z23 Encounter for immunization: Secondary | ICD-10-CM | POA: Diagnosis not present

## 2019-12-09 ENCOUNTER — Encounter: Payer: Self-pay | Admitting: Physician Assistant

## 2019-12-09 DIAGNOSIS — R42 Dizziness and giddiness: Secondary | ICD-10-CM

## 2019-12-09 DIAGNOSIS — Z9109 Other allergy status, other than to drugs and biological substances: Secondary | ICD-10-CM

## 2019-12-10 MED ORDER — FLUTICASONE PROPIONATE 50 MCG/ACT NA SUSP: NASAL | 3 refills | Status: DC

## 2019-12-10 MED ORDER — MECLIZINE HCL 25 MG PO TABS: 25.0000 mg | ORAL_TABLET | Freq: Three times a day (TID) | ORAL | 11 refills | Status: DC | PRN

## 2019-12-18 ENCOUNTER — Telehealth: Payer: Self-pay | Admitting: Physician Assistant

## 2019-12-18 NOTE — Telephone Encounter (Signed)
Copied from [place] [id number]. Topic: Medicare AWV >> Dec 18, 2019 12:07 PM Cher Nakai R wrote: Reason for CRM:   Left message for patient to call back and schedule Medicare Annual Wellness Visit (AWV) in office.   If not able to come in office, please offer to do virtually.   Last AWV 12/05/2018  Please schedule at anytime with [facility] Health Advisor.  If any questions, please contact me at [phone]

## 2020-01-16 ENCOUNTER — Ambulatory Visit: Payer: Medicare HMO | Admitting: Dermatology

## 2020-01-16 ENCOUNTER — Other Ambulatory Visit: Payer: Self-pay

## 2020-01-16 ENCOUNTER — Encounter: Payer: Self-pay | Admitting: Dermatology

## 2020-01-16 DIAGNOSIS — L7211 Pilar cyst: Secondary | ICD-10-CM

## 2020-01-16 DIAGNOSIS — D1800 Hemangioma unspecified site: Secondary | ICD-10-CM | POA: Diagnosis not present

## 2020-01-16 DIAGNOSIS — R21 Rash and other nonspecific skin eruption: Secondary | ICD-10-CM | POA: Diagnosis not present

## 2020-01-16 DIAGNOSIS — L821 Other seborrheic keratosis: Secondary | ICD-10-CM

## 2020-01-16 DIAGNOSIS — L820 Inflamed seborrheic keratosis: Secondary | ICD-10-CM | POA: Diagnosis not present

## 2020-01-16 MED ORDER — TRIAMCINOLONE ACETONIDE 0.1 % EX CREA: 1.0000 "application " | TOPICAL_CREAM | Freq: Two times a day (BID) | CUTANEOUS | 2 refills | Status: DC | PRN

## 2020-01-16 NOTE — Progress Notes (Addendum)
New Patient Visit  Subjective  Katie Bass is a 70 y.o. female who presents for the following: Cyst (Patient has cyst at front of scalp that has been there for about 5 years. It has not had any drainage but does get sore and irritated due to location. She also has spots at right cheek and left nose, present for years but do itch and get irritated. Patient has other spots at left neck and post neck that get caught on clothes. She advises there is a spot at right ankle that has been present for about 3 weeks but has begin to spread and get lighter. ).  Patient with no history of skin cancer.   The following portions of the chart were reviewed this encounter and updated as appropriate:   Tobacco  Allergies  Meds  Problems  Med Hx  Surg Hx  Fam Hx      Review of Systems:  No other skin or systemic complaints except as noted in HPI or Assessment and Plan.  Objective  Well appearing patient in no apparent distress; mood and affect are within normal limits.  A focused examination was performed including scalp, face, right ankle, neck. Relevant physical exam findings are noted in the Assessment and Plan.  Objective  Scalp: Subcutaneous nodule at scalp.   Objective  Left nasal sidewall x 1, right lateral cheek x 1, right medial cheek x 1, mid abdomen x 1, mid back x 1, left flank x 1 (6), Left neck, left posterior base of neck (2): Erythematous keratotic or waxy stuck-on papule or plaque.   Objective  Right pretibia: Scaly pink thin plaque   Assessment & Plan  Pilar cyst Scalp  Benign-appearing. Exam most consistent with an benign pilar cyst. Discussed that a cyst is a benign growth that can grow over time and sometimes get irritated or inflamed. Recommend observation if it is not bothersome. Discussed option of surgical excision to remove it if it is growing, symptomatic, or other changes noted. Please call for new or changing lesions so they can be evaluated.  Inflamed  seborrheic keratosis (8) Left nasal sidewall x 1, right lateral cheek x 1, right medial cheek x 1, mid abdomen x 1, mid back x 1, left flank x 1 (6); Left neck, left posterior base of neck (2)  Prior to procedure, discussed risks of blister formation, small wound, skin dyspigmentation, or rare scar following cryotherapy and risk of scar, dyspigmentation and recurrence following curettage.   Destruction of lesion - Left nasal sidewall x 1, right lateral cheek x 1, right medial cheek x 1, mid abdomen x 1, mid back x 1, left flank x 1 Complexity: simple   Destruction method: cryotherapy   Informed consent: discussed and consent obtained   Lesion destroyed using liquid nitrogen: Yes   Cryotherapy cycles:  2 Outcome: patient tolerated procedure well with no complications   Post-procedure details: wound care instructions given    Destruction of lesion - Left neck, left posterior base of neck  Destruction method: electrodesiccation and curettage   Destruction method comment:  Curettage ONLY, no desiccation Informed consent: discussed and consent obtained   Timeout:  patient name, date of birth, surgical site, and procedure verified Patient was prepped and draped in usual sterile fashion: area prepped with isopropyl alcohol. Anesthesia: the lesion was anesthetized in a standard fashion   Anesthetic:  1% lidocaine w/ epinephrine 1-100,000 buffered w/ 8.4% NaHCO3 Hemostasis achieved with:  aluminum chloride Outcome: patient tolerated procedure well  with no complications   Post-procedure details: wound care instructions given   Additional details:  Mupirocin and a pressure dressing applied  Rash Right pretibia  Favor nummular eczema  KOH negative  Start TMC 0.1% cream twice daily to AA as needed for itch up to 2 weeks. Avoid applying to face, groin, and axilla. Use as directed. Risk of skin atrophy with long-term use reviewed.   Topical steroids (such as triamcinolone, fluocinolone,  fluocinonide, mometasone, clobetasol, halobetasol, betamethasone, hydrocortisone) can cause thinning and lightening of the skin if they are used for too long in the same area. Your physician has selected the right strength medicine for your problem and area affected on the body. Please use your medication only as directed by your physician to prevent side effects.     Ordered Medications: triamcinolone (KENALOG) 0.1 %  Seborrheic Keratoses - Stuck-on, waxy, tan-brown papules and plaques  - Discussed benign etiology and prognosis. - Observe - Call for any changes  Hemangiomas - Red papules - Discussed benign nature - Observe - Call for any changes  Return if symptoms worsen or fail to improve.  Graciella Belton, RMA, am acting as scribe for Forest Gleason, MD .  Documentation: I have reviewed the above documentation for accuracy and completeness, and I agree with the above.  Forest Gleason, MD

## 2020-01-16 NOTE — Patient Instructions (Addendum)
Pre-Operative Instructions  You are scheduled for a surgical procedure at [facility]. We recommend you read the following instructions. If you have any questions or concerns, please call the office at [phone].  1. Shower and wash the entire body with soap and water the day of your surgery paying special attention to cleansing at and around the planned surgery site.  2. Avoid aspirin or aspirin containing products at least fourteen (14) days prior to your surgical procedure and for at least one week (7 Days) after your surgical procedure. If you take aspirin on a regular basis for heart disease or history of stroke or for any other reason, we may recommend you continue taking aspirin but please notify us if you take this on a regular basis. Aspirin can cause more bleeding to occur during surgery as well as prolonged bleeding and bruising after surgery.   3. Avoid other nonsteroidal pain medications at least one week prior to surgery and at least one week prior to your surgery. These include medications such as Ibuprofen (Motrin, Advil and Nuprin), Naprosyn, Voltaren, Relafen, etc. If medications are used for therapeutic reasons, please inform us as they can cause increased bleeding or prolonged bleeding during and bruising after surgical procedures.   4. Please advise [place] if you are taking any "blood thinner" medications such as Coumadin or Dipyridamole or Plavix or similar medications. These cause increased bleeding and prolonged bleeding during procedures and bruising after surgical procedures. We may have to consider discontinuing these medications briefly prior to and shortly after your surgery if safe to do so.   5. Please inform us of all medications you are currently taking. All medications that are taken regularly should be taken the day of surgery as you always do. Nevertheless, we need to be informed of what medications you are taking prior to surgery to know whether they will  affect the procedure or cause any complications.   6. Please inform us of any medication allergies. Also inform us of whether you have allergies to Latex or rubber products or whether you have had any adverse reaction to Lidocaine or Epinephrine.  7. Please inform us of any prosthetic or artificial body parts such as artificial heart valve, joint replacements, etc., or similar condition that might require preoperative antibiotics.   8. We recommend avoidance of alcohol at least two weeks prior to surgery and continued avoidance for at least two weeks after surgery.   9. We recommend discontinuation of tobacco smoking at least two weeks prior to surgery and continued abstinence for at least two weeks after surgery.  10. Do not plan strenuous exercise, strenuous work or strenuous lifting for approximately four weeks after your surgery.   11. We request if you are unable to make your scheduled surgical appointment, please call us at least a week in advance or as soon as you are aware of a problem so that we can cancel or reschedule the appointment.   12. You MAY TAKE TYLENOL (acetaminophen) for pain as it is not a blood thinner.   13. PLEASE PLAN TO BE IN TOWN FOR TWO WEEKS FOLLOWING SURGERY, THIS IS IMPORTANT SO YOU CAN BE CHECKED FOR DRESSING CHANGES, SUTURE REMOVAL AND TO MONITOR FOR POSSIBLE COMPLICATIONS.   Seborrheic Keratosis  What causes seborrheic keratoses? Seborrheic keratoses are harmless, common skin growths that first appear during adult life.  As time goes by, more growths appear.  Some people may develop a large number of them.  Seborrheic keratoses  appear on both covered and uncovered body parts.  They are not caused by sunlight.  The tendency to develop seborrheic keratoses can be inherited.  They vary in color from skin-colored to gray, brown, or even black.  They can be either smooth or have a rough, warty surface.   Seborrheic keratoses are superficial and look as if they were  stuck on the skin.  Under the microscope this type of keratosis looks like layers upon layers of skin.  That is why at times the top layer may seem to fall off, but the rest of the growth remains and re-grows.    Treatment Seborrheic keratoses do not need to be treated, but can easily be removed in the office.  Seborrheic keratoses often cause symptoms when they rub on clothing or jewelry.  Lesions can be in the way of shaving.  If they become inflamed, they can cause itching, soreness, or burning.  Removal of a seborrheic keratosis can be accomplished by freezing, burning, or surgery. If any spot bleeds, scabs, or grows rapidly, please return to have it checked, as these can be an indication of a skin cancer.  Cryotherapy Aftercare  . Wash gently with soap and water everyday.   Marland Kitchen Apply Vaseline and Band-Aid daily until healed.  Prior to procedure, discussed risks of blister formation, small wound, skin dyspigmentation, or rare scar following cryotherapy.   Topical steroids (such as triamcinolone, fluocinolone, fluocinonide, mometasone, clobetasol, halobetasol, betamethasone, hydrocortisone) can cause thinning and lightening of the skin if they are used for too long in the same area. Your physician has selected the right strength medicine for your problem and area affected on the body. Please use your medication only as directed by your physician to prevent side effects.   Gentle Skin Care Guide  1. Bathe no more than once a day.  2. Avoid bathing in hot water  3. Use a mild soap like Dove, Vanicream, Cetaphil, CeraVe. Can use Lever 2000 or Cetaphil antibacterial soap  4. Use soap only where you need it. On most days, use it under your arms, between your legs, and on your feet. Let the water rinse other areas unless visibly dirty.  5. When you get out of the bath/shower, use a towel to gently blot your skin dry, don't rub it.  6. While your skin is still a little damp, apply a moisturizing  cream such as Vanicream, CeraVe, Cetaphil, Eucerin, Sarna lotion or plain Vaseline Jelly. For hands apply [employer] [place] Hand Cream or Excipial Hand Cream.  7. Reapply moisturizer any time you start to itch or feel dry.  8. Sometimes using free and clear laundry detergents can be helpful. Fabric softener sheets should be avoided. Downy Free & Gentle liquid, or any liquid fabric softener that is free of dyes and perfumes, it acceptable to use  9. If your doctor has given you prescription creams you may apply moisturizers over them

## 2020-01-18 ENCOUNTER — Telehealth (INDEPENDENT_AMBULATORY_CARE_PROVIDER_SITE_OTHER): Payer: Medicare HMO | Admitting: Physician Assistant

## 2020-01-18 ENCOUNTER — Encounter: Payer: Self-pay | Admitting: Physician Assistant

## 2020-01-18 DIAGNOSIS — J0140 Acute pansinusitis, unspecified: Secondary | ICD-10-CM

## 2020-01-18 MED ORDER — AMOXICILLIN-POT CLAVULANATE 875-125 MG PO TABS: 1.0000 | ORAL_TABLET | Freq: Two times a day (BID) | ORAL | 0 refills | Status: DC

## 2020-01-18 NOTE — Telephone Encounter (Signed)
Can we call and add her as a virtual appt today please

## 2020-01-18 NOTE — Progress Notes (Signed)
MyChart Video Visit    Virtual Visit via Video Note   This visit type was conducted due to national recommendations for restrictions regarding the COVID-19 Pandemic (e.g. social distancing) in an effort to limit this patient's exposure and mitigate transmission in our community. This patient is at least at moderate risk for complications without adequate follow up. This format is felt to be most appropriate for this patient at this time. Physical exam was limited by quality of the video and audio technology used for the visit.   Interactive audio and video communications were attempted, although failed due to patient's inability to connect to video. Continued visit with audio only interaction with patient agreement.  Interactive audio and video communications were attempted, although failed due to patient's inability to connect to video. Continued visit with audio only interaction with patient agreement.  Patient location: Home Provider location: Home office in [place], [place]  I discussed the limitations of evaluation and management by telemedicine and the availability of in person appointments. The patient expressed understanding and agreed to proceed.  Patient: Katie Bass   DOB: 07-21-1950   70 y.o. Female  MRN: [id number] Visit Date: 01/18/2020  Today's healthcare provider: Mar Daring, PA-C   Chief Complaint  Patient presents with   Sinusitis   Subjective    HPI  Patient with c/o sinus congestion and some vertigo episodes. She has been taking half of the meclizine because she is not able to take a whole to take care of her husband.Reports that she has been taking sinus medicines since Thanksgiving. Has Tried Meclizine,flonase,fexofenadine and Mucinex.  Patient Active Problem List   Diagnosis Date Noted   Hypercholesterolemia 01/25/2018   BMI 28.0-28.9,adult 01/25/2018   Extrinsic asthma with exacerbation 10/29/2014   Clinical depression 10/29/2014    Disease of pharynx 10/29/2014   Positional vertigo 10/29/2014   Fibroid 10/29/2014   Avitaminosis D 10/29/2014   Allergic rhinitis 08/01/2008   Dermatitis seborrheica 08/01/2008   Past Medical History:  Diagnosis Date   Actinic keratosis 06/06/2012   R chest 4.0 cm lat to sternum - biopsy proven   Asthma    Depression       Medications: Outpatient Medications Prior to Visit  Medication Sig   Ergocalciferol (VITAMIN D2) 2000 UNITS TABS Take 1 tablet by mouth daily.   fluticasone (FLONASE) 50 MCG/ACT nasal spray Use 2 spray(s) in each nostril once daily   meclizine (ANTIVERT) 25 MG tablet Take 1 tablet (25 mg total) by mouth 3 (three) times daily as needed for dizziness.   Multiple Vitamin tablet Take 1 tablet by mouth daily.   Omega-3 Fatty Acids (FISH OIL) 1000 MG CAPS Take by mouth daily.   albuterol (VENTOLIN HFA) 108 (90 Base) MCG/ACT inhaler Inhale 1-2 puffs into the lungs every 6 (six) hours as needed for wheezing or shortness of breath. (Patient not taking: Reported on 01/16/2020)   amoxicillin-clavulanate (AUGMENTIN) 875-125 MG tablet Take 1 tablet by mouth 2 (two) times daily.   Fexofenadine-Pseudoephedrine (ALLEGRA-D 24 HOUR PO) Take 1 tablet by mouth daily. (Patient not taking: No sig reported)   montelukast (SINGULAIR) 10 MG tablet Take 1 tablet (10 mg total) by mouth at bedtime.   predniSONE (DELTASONE) 20 MG tablet Take 1 tablet (20 mg total) by mouth daily with breakfast.   triamcinolone (KENALOG) 0.1 % Apply 1 application topically 2 (two) times daily as needed. Up to 2 weeks as needed for itch. Avoid applying to face, groin, and axilla. Use as directed.  Risk of skin atrophy with long-term use reviewed.   triamcinolone cream (KENALOG) 0.1 % Apply 1 application topically 3 (three) times daily. (Patient not taking: Reported on 01/16/2020)   No facility-administered medications prior to visit.    Review of Systems  Constitutional: Negative for chills  and fever.  HENT: Positive for congestion, postnasal drip, rhinorrhea, sinus pressure, sinus pain and sore throat. Negative for ear pain and sneezing.   Respiratory: Positive for cough. Negative for chest tightness, shortness of breath and wheezing.   Cardiovascular: Negative for chest pain, palpitations and leg swelling.  Gastrointestinal: Negative for abdominal pain.  Neurological: Positive for dizziness and headaches.    Last CBC Lab Results  Component Value Date   WBC 5.4 01/25/2018   HGB 14.3 01/25/2018   HCT 43.1 01/25/2018   MCV 88 01/25/2018   MCH 29.2 01/25/2018   RDW 13.1 01/25/2018   PLT 237 07/37/1062   Last metabolic panel Lab Results  Component Value Date   GLUCOSE 90 01/25/2018   NA 142 01/25/2018   K 4.4 01/25/2018   CL 103 01/25/2018   CO2 23 01/25/2018   BUN 10 01/25/2018   CREATININE 0.68 01/25/2018   GFRNONAA 91 01/25/2018   GFRAA 105 01/25/2018   CALCIUM 10.0 01/25/2018   PROT 6.5 01/25/2018   ALBUMIN 4.4 01/25/2018   LABGLOB 2.1 01/25/2018   AGRATIO 2.1 01/25/2018   BILITOT 0.4 01/25/2018   ALKPHOS 105 01/25/2018   AST 18 01/25/2018   ALT 15 01/25/2018      Objective    There were no vitals taken for this visit. BP Readings from Last 3 Encounters:  01/25/18 118/78  12/05/17 124/82  12/02/17 136/72   Wt Readings from Last 3 Encounters:  01/25/18 162 lb (73.5 kg)  12/05/17 162 lb 9.6 oz (73.8 kg)  12/02/17 162 lb 12.8 oz (73.8 kg)      Physical Exam     Assessment & Plan     1. Acute non-recurrent pansinusitis Worsening symptoms that have not responded to OTC medications. Will give augmentin as below. Continue allergy medications. Stay well hydrated and get plenty of rest. Call if no symptom improvement or if symptoms worsen. - amoxicillin-clavulanate (AUGMENTIN) 875-125 MG tablet; Take 1 tablet by mouth 2 (two) times daily.  Dispense: 20 tablet; Refill: 0   No follow-ups on file.     I discussed the assessment and treatment  plan with the patient. The patient was provided an opportunity to ask questions and all were answered. The patient agreed with the plan and demonstrated an understanding of the instructions.   The patient was advised to call back or seek an in-person evaluation if the symptoms worsen or if the condition fails to improve as anticipated.  I provided 12 minutes of non-face-to-face time during this encounter.  Reynolds Bowl, PA-C, have reviewed all documentation for this visit. The documentation on 01/19/20 for the exam, diagnosis, procedures, and orders are all accurate and complete.  [facility] [phone] (phone) [phone] (fax)  [place]

## 2020-01-19 ENCOUNTER — Encounter: Payer: Self-pay | Admitting: Physician Assistant

## 2020-01-20 ENCOUNTER — Encounter: Payer: Self-pay | Admitting: Physician Assistant

## 2020-01-20 DIAGNOSIS — J0140 Acute pansinusitis, unspecified: Secondary | ICD-10-CM

## 2020-01-21 MED ORDER — PREDNISONE 20 MG PO TABS: 20.0000 mg | ORAL_TABLET | Freq: Every day | ORAL | 0 refills | Status: DC

## 2020-02-14 ENCOUNTER — Encounter: Payer: Self-pay | Admitting: Physician Assistant

## 2020-02-14 DIAGNOSIS — F4321 Adjustment disorder with depressed mood: Secondary | ICD-10-CM

## 2020-02-14 MED ORDER — ALPRAZOLAM 0.25 MG PO TABS: 0.2500 mg | ORAL_TABLET | Freq: Every evening | ORAL | 0 refills | Status: DC | PRN

## 2020-04-09 ENCOUNTER — Ambulatory Visit: Payer: Medicare HMO | Admitting: Dermatology

## 2020-04-09 ENCOUNTER — Encounter: Payer: Self-pay | Admitting: Dermatology

## 2020-04-09 ENCOUNTER — Other Ambulatory Visit: Payer: Self-pay

## 2020-04-09 DIAGNOSIS — L7211 Pilar cyst: Secondary | ICD-10-CM | POA: Diagnosis not present

## 2020-04-09 MED ORDER — DOXYCYCLINE MONOHYDRATE 100 MG PO CAPS: 100.0000 mg | ORAL_CAPSULE | Freq: Two times a day (BID) | ORAL | 0 refills | Status: AC

## 2020-04-09 MED ORDER — MUPIROCIN 2 % EX OINT: 1.0000 "application " | TOPICAL_OINTMENT | Freq: Every day | CUTANEOUS | 0 refills | Status: DC

## 2020-04-09 NOTE — Patient Instructions (Addendum)

## 2020-04-09 NOTE — Progress Notes (Signed)
Follow-Up Visit   Subjective  Katie Bass is a 70 y.o. female who presents for the following: Procedure (Patient here today for excision of pilar cyst vs other at scalp. ).   The following portions of the chart were reviewed this encounter and updated as appropriate:   Tobacco  Allergies  Meds  Problems  Med Hx  Surg Hx  Fam Hx      Review of Systems:  No other skin or systemic complaints except as noted in HPI or Assessment and Plan.  Objective  Well appearing patient in no apparent distress; mood and affect are within normal limits.  A focused examination was performed including scalp. Relevant physical exam findings are noted in the Assessment and Plan.  Objective  Right Frontal Scalp: Subcutaneous nodule at scalp 1.3cm   Assessment & Plan  Pilar cyst Right Frontal Scalp  Symptomatic so will excise.  Start doxycycline monohydrate 100mg  BID x 5 days.  Start mupirocin daily  Doxycycline should be taken with food to prevent nausea. Do not lay down for 30 minutes after taking. Be cautious with sun exposure and use good sun protection while on this medication. Pregnant women should not take this medication.    Skin excision - Right Frontal Scalp  Lesion length (cm):  1.3 Total excision diameter (cm):  1.3 Informed consent: discussed and consent obtained   Timeout: patient name, date of birth, surgical site, and procedure verified   Procedure prep:  Patient was prepped and draped in usual sterile fashion Prep type:  Chlorhexidine Anesthesia: the lesion was anesthetized in a standard fashion   Anesthesia comment:  5cc Anesthetic:  1% lidocaine w/ epinephrine 1-100,000 buffered w/ 8.4% NaHCO3 Instrument used comment:  15c Hemostasis achieved with: suture, pressure and electrodesiccation   Outcome: patient tolerated procedure well with no complications   Post-procedure details: wound care instructions given   Additional details:  Mupirocin and a pressure  dressing applied  Skin repair - Right Frontal Scalp Complexity:  Intermediate Final length (cm):  1.8 Informed consent: discussed and consent obtained   Timeout: patient name, date of birth, surgical site, and procedure verified   Procedure prep:  Patient was prepped and draped in usual sterile fashion Prep type:  Chlorhexidine Anesthesia: the lesion was anesthetized in a standard fashion   Anesthetic:  1% lidocaine w/ epinephrine 1-100,000 local infiltration Reason for type of repair: reduce tension to allow closure, reduce the risk of dehiscence, infection, and necrosis, reduce subcutaneous dead space and avoid a hematoma, allow closure of the large defect, allow side-to-side closure without requiring a flap or graft and enhance both functionality and cosmetic results   Undermining: area extensively undermined   Subcutaneous layers (deep stitches):  Stitches:  Buried vertical mattress Fine/surface layer approximation (top stitches):  Suture size:  3-0 Suture type: Prolene (polypropylene)   Suture removal (days):  14 Hemostasis achieved with: pressure and electrodesiccation Outcome: patient tolerated procedure well with no complications   Post-procedure details: wound care instructions given   Additional details:  Mupirocin and a pressure bandage applied   Specimen 1 - Surgical pathology Differential Diagnosis: r/o Pilar cyst vs Other  Check Margins: No Subcutaneous nodule at scalp 1.3cm  Ordered Medications: doxycycline (MONODOX) 100 MG capsule mupirocin ointment (BACTROBAN) 2 %  Return in about 2 weeks (around 04/23/2020) for Suture Removal.  Graciella Belton, RMA, am acting as scribe for Forest Gleason, MD .  Documentation: I have reviewed the above documentation for accuracy and completeness, and I agree  with the above.  Forest Gleason, MD

## 2020-04-15 ENCOUNTER — Encounter: Payer: Self-pay | Admitting: Physician Assistant

## 2020-04-23 ENCOUNTER — Ambulatory Visit (INDEPENDENT_AMBULATORY_CARE_PROVIDER_SITE_OTHER): Payer: Medicare HMO | Admitting: Dermatology

## 2020-04-23 ENCOUNTER — Other Ambulatory Visit: Payer: Self-pay

## 2020-04-23 DIAGNOSIS — L820 Inflamed seborrheic keratosis: Secondary | ICD-10-CM | POA: Diagnosis not present

## 2020-04-23 DIAGNOSIS — L308 Other specified dermatitis: Secondary | ICD-10-CM

## 2020-04-23 NOTE — Progress Notes (Signed)
   Follow-Up Visit   Subjective  Katie Bass is a 70 y.o. female who presents for the following: Follow-up (Patient here today for suture removal at right frontal scalp for bx proven pilar cyst. ).  She also notes an irritated spot behind her ear and an itchy area at her right leg.  The following portions of the chart were reviewed this encounter and updated as appropriate:   Tobacco  Allergies  Meds  Problems  Med Hx  Surg Hx  Fam Hx      Review of Systems:  No other skin or systemic complaints except as noted in HPI or Assessment and Plan.  Objective  Well appearing patient in no apparent distress; mood and affect are within normal limits.  A focused examination was performed including scalp. Relevant physical exam findings are noted in the Assessment and Plan.  Objective  Right pretibial: Scaly pink plaque  Objective  right preauricular: Erythematous keratotic or waxy stuck-on papule or plaque.    Assessment & Plan  Other eczema Right pretibial  Restart TMC 0.1% cream BID for up to 2 weeks. Avoid applying to face, groin, and axilla. Use as directed. Risk of skin atrophy with long-term use reviewed.   Topical steroids (such as triamcinolone, fluocinolone, fluocinonide, mometasone, clobetasol, halobetasol, betamethasone, hydrocortisone) can cause thinning and lightening of the skin if they are used for too long in the same area. Your physician has selected the right strength medicine for your problem and area affected on the body. Please use your medication only as directed by your physician to prevent side effects.    Inflamed seborrheic keratosis right preauricular  Prior to procedure, discussed risks of blister formation, small wound, skin dyspigmentation, or rare scar following cryotherapy.    Destruction of lesion - right preauricular  Destruction method: cryotherapy   Informed consent: discussed and consent obtained   Lesion destroyed using liquid  nitrogen: Yes   Cryotherapy cycles:  2 Outcome: patient tolerated procedure well with no complications   Post-procedure details: wound care instructions given     Encounter for Removal of Sutures - Incision site at the right frontal scalp is clean, dry and intact - Wound cleansed, sutures removed, wound cleansed and steri strips applied.  - Discussed pathology results showing pilar cyst  - Patient advised to keep steri-strips dry until they fall off. - Scars remodel for a full year. - Once steri-strips fall off, patient can apply over-the-counter silicone scar cream each night to help with scar remodeling if desired. - Patient advised to call with any concerns or if they notice any new or changing lesions.   Return in 2 months (on 06/23/2020) for ISK follow up.  Graciella Belton, RMA, am acting as scribe for Forest Gleason, MD .  Documentation: I have reviewed the above documentation for accuracy and completeness, and I agree with the above.  Forest Gleason, MD

## 2020-04-23 NOTE — Patient Instructions (Addendum)
May use topical over the counter lidocaine or voltaren gel as needed if painful. Continue mupirocin for 3-4 more days to area at scalp.   Recommend OTC [place] Rapid Relief Anti-Itch cream (pramoxine + menthol) up to 3 times per day to areas that are itchy.  Cryotherapy Aftercare  . Wash gently with soap and water everyday.   Marland Kitchen Apply Vaseline and Band-Aid daily until healed.  Prior to procedure, discussed risks of blister formation, small wound, skin dyspigmentation, or rare scar following cryotherapy.   If you have any questions or concerns for your doctor, please call our main line at [phone] and press option 4 to reach your doctor's medical assistant. If no one answers, please leave a voicemail as directed and we will return your call as soon as possible. Messages left after 4 pm will be answered the following business day.   You may also send [place] a message via [place]. We typically respond to MyChart messages within 1-2 business days.  For prescription refills, please ask your pharmacy to contact our office. Our fax number is [phone].  If you have an urgent issue when the clinic is closed that cannot wait until the next business day, you can page your doctor at the number below.    Please note that while we do our best to be available for urgent issues outside of office hours, we are not available 24/7.   If you have an urgent issue and are unable to reach [place], you may choose to seek medical care at your doctor's office, retail clinic, urgent care center, or emergency room.  If you have a medical emergency, please immediately call 911 or go to the emergency department.  Pager Numbers  - Dr. Nehemiah Massed: [phone]  - Dr. Laurence Ferrari: [phone]  - Dr. Nicole Kindred: [phone]  In the event of inclement weather, please call our main line at [phone] for an update on the status of any delays or closures.  Dermatology Medication Tips: Please keep the boxes that topical  medications come in in order to help keep track of the instructions about where and how to use these. Pharmacies typically print the medication instructions only on the boxes and not directly on the medication tubes.   If your medication is too expensive, please contact our office at [phone] option 4 or send [place] a message through [place].   We are unable to tell what your co-pay for medications will be in advance as this is different depending on your insurance coverage. However, we may be able to find a substitute medication at lower cost or fill out paperwork to get insurance to cover a needed medication.   If a prior authorization is required to get your medication covered by your insurance company, please allow [place] 1-2 business days to complete this process.  Drug prices often vary depending on where the prescription is filled and some pharmacies may offer cheaper prices.  The website www.goodrx.com contains coupons for medications through different pharmacies. The prices here do not account for what the cost may be with help from insurance (it may be cheaper with your insurance), but the website can give you the price if you did not use any insurance.  - You can print the associated coupon and take it with your prescription to the pharmacy.  - You may also stop by our office during regular business hours and pick up a GoodRx coupon card.  - If you need your prescription sent electronically to a different pharmacy,  notify our office through [facility] or by phone at [phone] option 4.

## 2020-04-30 ENCOUNTER — Encounter: Payer: Self-pay | Admitting: Dermatology

## 2020-07-10 ENCOUNTER — Ambulatory Visit (INDEPENDENT_AMBULATORY_CARE_PROVIDER_SITE_OTHER): Payer: Medicare HMO | Admitting: Family Medicine

## 2020-07-10 ENCOUNTER — Encounter: Payer: Self-pay | Admitting: Family Medicine

## 2020-07-10 ENCOUNTER — Other Ambulatory Visit: Payer: Self-pay

## 2020-07-10 ENCOUNTER — Ambulatory Visit: Payer: Medicare HMO | Admitting: Dermatology

## 2020-07-10 VITALS — BP 131/70 | HR 80 | Temp 98.4°F | Resp 16 | Wt 162.1 lb

## 2020-07-10 DIAGNOSIS — Z0000 Encounter for general adult medical examination without abnormal findings: Secondary | ICD-10-CM

## 2020-07-10 DIAGNOSIS — Z1231 Encounter for screening mammogram for malignant neoplasm of breast: Secondary | ICD-10-CM | POA: Diagnosis not present

## 2020-07-10 DIAGNOSIS — E7800 Pure hypercholesterolemia, unspecified: Secondary | ICD-10-CM

## 2020-07-10 DIAGNOSIS — Z780 Asymptomatic menopausal state: Secondary | ICD-10-CM

## 2020-07-10 NOTE — Progress Notes (Signed)
Established patient visit   Patient: Katie Bass   DOB: 05/03/1950   70 y.o. Female  MRN: [id number] Visit Date: 07/10/2020  Today's healthcare provider: Lavon Paganini, MD   Chief Complaint  Patient presents with   Annual Exam   Subjective     Mammogram  She has had sisters whom have had breast cancer in the past. So she is concerned about the risk of the diagnosis and she is interested in receiving her mammogram every year versus every 3 years.   Vaccines She has received her 1st pneumonia vaccine and is due for the 2nd. She hasn't received the shingles vaccine because before insurance wouldn't cover the cost. She received 2 COVID vaccines and she is concerned about the side effects of the boosters. She was sick the first 2 days after the 1st and 2nd vaccine.  -----------------------------------------------------------------------------------------  Patient Active Problem List   Diagnosis Date Noted   Hypercholesterolemia 01/25/2018   BMI 28.0-28.9,adult 01/25/2018   Extrinsic asthma with exacerbation 10/29/2014   Clinical depression 10/29/2014   Disease of pharynx 10/29/2014   Positional vertigo 10/29/2014   Fibroid 10/29/2014   Avitaminosis D 10/29/2014   Allergic rhinitis 08/01/2008   Dermatitis seborrheica 08/01/2008   Past Medical History:  Diagnosis Date   Actinic keratosis 06/06/2012   R chest 4.0 cm lat to sternum - biopsy proven   Asthma    Depression    Allergies  Allergen Reactions   Sulfa Antibiotics Itching and Swelling   Medications: Outpatient Medications Prior to Visit  Medication Sig   Ergocalciferol (VITAMIN D2) 2000 UNITS TABS Take 1 tablet by mouth daily.   fluticasone (FLONASE) 50 MCG/ACT nasal spray Use 2 spray(s) in each nostril once daily   Multiple Vitamin tablet Take 1 tablet by mouth daily.   Omega-3 Fatty Acids (FISH OIL) 1000 MG CAPS Take by mouth daily.   [DISCONTINUED] albuterol (VENTOLIN HFA) 108 (90 Base) MCG/ACT  inhaler Inhale 1-2 puffs into the lungs every 6 (six) hours as needed for wheezing or shortness of breath. (Patient not taking: No sig reported)   [DISCONTINUED] ALPRAZolam (XANAX) 0.25 MG tablet Take 1 tablet (0.25 mg total) by mouth at bedtime as needed for anxiety.   [DISCONTINUED] amoxicillin-clavulanate (AUGMENTIN) 875-125 MG tablet Take 1 tablet by mouth 2 (two) times daily.   [DISCONTINUED] Fexofenadine-Pseudoephedrine (ALLEGRA-D 24 HOUR PO) Take 1 tablet by mouth daily. (Patient not taking: No sig reported)   [DISCONTINUED] meclizine (ANTIVERT) 25 MG tablet Take 1 tablet (25 mg total) by mouth 3 (three) times daily as needed for dizziness. (Patient not taking: Reported on 07/10/2020)   [DISCONTINUED] mupirocin ointment (BACTROBAN) 2 % Apply 1 application topically daily.   [DISCONTINUED] predniSONE (DELTASONE) 20 MG tablet Take 1 tablet (20 mg total) by mouth daily with breakfast.   [DISCONTINUED] triamcinolone (KENALOG) 0.1 % Apply 1 application topically 2 (two) times daily as needed. Up to 2 weeks as needed for itch. Avoid applying to face, groin, and axilla. Use as directed. Risk of skin atrophy with long-term use reviewed. (Patient not taking: Reported on 07/10/2020)   [DISCONTINUED] triamcinolone cream (KENALOG) 0.1 % Apply 1 application topically 3 (three) times daily. (Patient not taking: No sig reported)   No facility-administered medications prior to visit.   Review of Systems  Constitutional:  Negative for appetite change, chills, fatigue and fever.  HENT:  Negative for ear pain, sinus pressure, sinus pain and sore throat.   Eyes:  Negative for pain and visual disturbance.  Respiratory:  Negative for cough, chest tightness, shortness of breath and wheezing.   Cardiovascular:  Negative for chest pain, palpitations and leg swelling.  Gastrointestinal:  Negative for abdominal pain, blood in stool, diarrhea, nausea and vomiting.  Genitourinary:  Negative for dysuria, flank pain,  frequency, pelvic pain and urgency.  Musculoskeletal:  Negative for back pain and myalgias.  Neurological:  Negative for dizziness, seizures, syncope, weakness, light-headedness, numbness and headaches.      Objective    BP 131/70   Pulse 80   Temp 98.4 F (36.9 C) (Oral)   Resp 16   Wt 162 lb 1.6 oz (73.5 kg)   SpO2 98%   BMI 28.71 kg/m     Physical Exam Vitals reviewed.  Constitutional:      General: She is not in acute distress.    Appearance: Normal appearance. She is well-developed. She is not diaphoretic.  HENT:     Head: Normocephalic and atraumatic.     Right Ear: Tympanic membrane, ear canal and external ear normal.     Left Ear: Tympanic membrane, ear canal and external ear normal.     Nose: Nose normal.     Mouth/Throat:     Mouth: Mucous membranes are moist.     Pharynx: Oropharynx is clear. No oropharyngeal exudate.  Eyes:     General: No scleral icterus.    Conjunctiva/sclera: Conjunctivae normal.     Pupils: Pupils are equal, round, and reactive to light.  Neck:     Thyroid: No thyromegaly.  Cardiovascular:     Rate and Rhythm: Normal rate and regular rhythm.     Pulses: Normal pulses.     Heart sounds: Normal heart sounds. No murmur heard. Pulmonary:     Effort: Pulmonary effort is normal. No respiratory distress.     Breath sounds: Normal breath sounds. No wheezing, rhonchi or rales.  Abdominal:     General: There is no distension.     Palpations: Abdomen is soft.     Tenderness: There is no abdominal tenderness.  Musculoskeletal:        General: No deformity.     Cervical back: Neck supple.     Right lower leg: No edema.     Left lower leg: No edema.  Lymphadenopathy:     Cervical: No cervical adenopathy.  Skin:    General: Skin is warm and dry.     Findings: No rash.  Neurological:     Mental Status: She is alert and oriented to person, place, and time. Mental status is at baseline.     Sensory: No sensory deficit.     Motor: No  weakness.     Gait: Gait normal.  Psychiatric:        Mood and Affect: Mood normal.        Behavior: Behavior normal.        Thought Content: Thought content normal.    Depression screen [facility] 2/9 07/10/2020 12/05/2018 12/02/2017 12/02/2017  Decreased Interest 0 0 0 0  Down, Depressed, Hopeless 0 0 0 0  PHQ - 2 Score 0 0 0 0  Altered sleeping 0 - 0 -  Tired, decreased energy 0 - 0 -  Change in appetite 0 - 0 -  Feeling bad or failure about yourself  0 - 0 -  Trouble concentrating 0 - 0 -  Moving slowly or fidgety/restless 0 - 0 -  Suicidal thoughts 0 - 0 -  PHQ-9 Score 0 - 0 -  Difficult doing work/chores Not difficult at all - Not difficult at all -    Functional Status Survey: Is the patient deaf or have difficulty hearing?: Yes Does the patient have difficulty seeing, even when wearing glasses/contacts?: No Does the patient have difficulty concentrating, remembering, or making decisions?: No Does the patient have difficulty walking or climbing stairs?: No Does the patient have difficulty dressing or bathing?: No Does the patient have difficulty doing errands alone such as visiting a doctor's office or shopping?: No  Fall Risk  07/10/2020 12/05/2018 12/02/2017 02/08/2017  Falls in the past year? 0 0 0 No  Number falls in past yr: 0 0 - -  Injury with Fall? 0 0 - -     No flowsheet data found.     Results for orders placed or performed in visit on 07/10/20  Lipid panel  Result Value Ref Range   Cholesterol, Total 248 (H) 100 - 199 mg/dL   Triglycerides 233 (H) 0 - 149 mg/dL   HDL 67 >39 mg/dL   VLDL Cholesterol Cal 41 (H) 5 - 40 mg/dL   LDL Chol Calc (NIH) 140 (H) 0 - 99 mg/dL   Chol/HDL Ratio 3.7 0.0 - 4.4 ratio  Comprehensive metabolic panel  Result Value Ref Range   Glucose 83 65 - 99 mg/dL   BUN 9 8 - 27 mg/dL   Creatinine, Ser 0.59 0.57 - 1.00 mg/dL   eGFR 98 >59 mL/min/1.73   BUN/Creatinine Ratio 15 12 - 28   Sodium 142 134 - 144 mmol/L   Potassium 4.3 3.5  - 5.2 mmol/L   Chloride 102 96 - 106 mmol/L   CO2 24 20 - 29 mmol/L   Calcium 10.2 8.7 - 10.3 mg/dL   Total Protein 6.7 6.0 - 8.5 g/dL   Albumin 4.3 3.8 - 4.8 g/dL   Globulin, Total 2.4 1.5 - 4.5 g/dL   Albumin/Globulin Ratio 1.8 1.2 - 2.2   Bilirubin Total <0.2 0.0 - 1.2 mg/dL   Alkaline Phosphatase 105 44 - 121 IU/L   AST 18 0 - 40 IU/L   ALT 15 0 - 32 IU/L    Assessment & Plan     Problem List Items Addressed This Visit       Other   Hypercholesterolemia   Relevant Orders   Lipid panel (Completed)   Comprehensive metabolic panel (Completed)   Other Visit Diagnoses     Encounter for annual wellness visit (AWV) in Medicare patient    -  Primary   Encounter for annual physical exam       Relevant Orders   MM 3D SCREEN BREAST BILATERAL   DG Bone Density   Lipid panel (Completed)   Comprehensive metabolic panel (Completed)   Encounter for screening mammogram for malignant neoplasm of breast       Relevant Orders   MM 3D SCREEN BREAST BILATERAL   Postmenopausal       Relevant Orders   DG Bone Density        Health Maintenance  Topic Date Due   COVID-19 Vaccine (1) Never done   TETANUS/TDAP  Never done   Zoster Vaccines- Shingrix (1 of 2) Never done   DEXA SCAN  Never done   PNA vac Low Risk Adult (2 of 2 - PPSV23) 12/03/2018   MAMMOGRAM  12/08/2019   INFLUENZA VACCINE  08/11/2020   COLONOSCOPY (Pts 45-18yr Insurance coverage will need to be confirmed)  06/08/2023   Hepatitis C Screening  Completed  HPV VACCINES  Aged Out    Immunization History  Administered Date(s) Administered   Fluad Quad(high Dose 65+) 10/06/2018, 09/18/2019   Influenza Whole 10/20/2009   Influenza, High Dose Seasonal PF 10/18/2016, 10/19/2017   Influenza,inj,Quad PF,6+ Mos 10/19/2012, 10/04/2013, 10/10/2014, 10/22/2015   Pneumococcal Conjugate-13 12/02/2017    Return in about 1 year (around 07/10/2021) for AWV, CPE.      I,Essence Turner,acting as a scribe for Lavon Paganini, MD.,have documented all relevant documentation on the behalf of Lavon Paganini, MD,as directed by  Lavon Paganini, MD while in the presence of Lavon Paganini, MD.  {I, Lavon Paganini, MD, have reviewed all documentation for this visit. The documentation on 07/11/20 for the exam, diagnosis, procedures, and orders are all accurate and complete.   Kynslee Baham, Dionne Bucy, MD, MPH [facility]

## 2020-07-10 NOTE — Patient Instructions (Addendum)
The CDC recommends two doses of Shingrix (the shingles vaccine) separated by 2 to 6 months for adults age 69 years and older. I recommend checking with your insurance plan regarding coverage for this vaccine.   Also think about TDAP (tetanus), COVID booster, and pneumovax (pneumonia) vaccines Check with the pharmacy on all but pneumovax (we give that here)    Preventive Care 65 Years and Older, Female Preventive care refers to lifestyle choices and visits with your health care provider that can promote health and wellness. This includes: A yearly physical exam. This is also called an annual wellness visit. Regular dental and eye exams. Immunizations. Screening for certain conditions. Healthy lifestyle choices, such as: Eating a healthy diet. Getting regular exercise. Not using drugs or products that contain nicotine and tobacco. Limiting alcohol use. What can I expect for my preventive care visit? Physical exam Your health care provider will check your: Height and weight. These may be used to calculate your BMI (body mass index). BMI is a measurement that tells if you are at a healthy weight. Heart rate and blood pressure. Body temperature. Skin for abnormal spots. Counseling Your health care provider may ask you questions about your: Past medical problems. Family's medical history. Alcohol, tobacco, and drug use. Emotional well-being. Home life and relationship well-being. Sexual activity. Diet, exercise, and sleep habits. History of falls. Memory and ability to understand (cognition). Work and work Statistician. Pregnancy and menstrual history. Access to firearms. What immunizations do I need?  Vaccines are usually given at various ages, according to a schedule. Your health care provider will recommend vaccines for you based on your age, medicalhistory, and lifestyle or other factors, such as travel or where you work. What tests do I need? Blood tests Lipid and cholesterol  levels. These may be checked every 5 years, or more often depending on your overall health. Hepatitis C test. Hepatitis B test. Screening Lung cancer screening. You may have this screening every year starting at age 52 if you have a 30-pack-year history of smoking and currently smoke or have quit within the past 15 years. Colorectal cancer screening. All adults should have this screening starting at age 66 and continuing until age 75. Your health care provider may recommend screening at age 67 if you are at increased risk. You will have tests every 1-10 years, depending on your results and the type of screening test. Diabetes screening. This is done by checking your blood sugar (glucose) after you have not eaten for a while (fasting). You may have this done every 1-3 years. Mammogram. This may be done every 1-2 years. Talk with your health care provider about how often you should have regular mammograms. Abdominal aortic aneurysm (AAA) screening. You may need this if you are a current or former smoker. BRCA-related cancer screening. This may be done if you have a family history of breast, ovarian, tubal, or peritoneal cancers. Other tests STD (sexually transmitted disease) testing, if you are at risk. Bone density scan. This is done to screen for osteoporosis. You may have this done starting at age 12. Talk with your health care provider about your test results, treatment options,and if necessary, the need for more tests. Follow these instructions at home: Eating and drinking  Eat a diet that includes fresh fruits and vegetables, whole grains, lean protein, and low-fat dairy products. Limit your intake of foods with high amounts of sugar, saturated fats, and salt. Take vitamin and mineral supplements as recommended by your health care provider.  Do not drink alcohol if your health care provider tells you not to drink. If you drink alcohol: Limit how much you have to 0-1 drink a day. Be  aware of how much alcohol is in your drink. In the [place] one drink equals one 12 oz bottle of beer (355 mL), one 5 oz glass of wine (148 mL), or one 1 oz glass of hard liquor (44 mL).  Lifestyle Take daily care of your teeth and gums. Brush your teeth every morning and night with fluoride toothpaste. Floss one time each day. Stay active. Exercise for at least 30 minutes 5 or more days each week. Do not use any products that contain nicotine or tobacco, such as cigarettes, e-cigarettes, and chewing tobacco. If you need help quitting, ask your health care provider. Do not use drugs. If you are sexually active, practice safe sex. Use a condom or other form of protection in order to prevent STIs (sexually transmitted infections). Talk with your health care provider about taking a low-dose aspirin or statin. Find healthy ways to cope with stress, such as: Meditation, yoga, or listening to music. Journaling. Talking to a trusted person. Spending time with friends and family. Safety Always wear your seat belt while driving or riding in a vehicle. Do not drive: If you have been drinking alcohol. Do not ride with someone who has been drinking. When you are tired or distracted. While texting. Wear a helmet and other protective equipment during sports activities. If you have firearms in your house, make sure you follow all gun safety procedures. What's next? Visit your health care provider once a year for an annual wellness visit. Ask your health care provider how often you should have your eyes and teeth checked. Stay up to date on all vaccines. This information is not intended to replace advice given to you by your health care provider. Make sure you discuss any questions you have with your healthcare provider. Document Revised: 12/19/2019 Document Reviewed: 12/22/2017 Elsevier Patient Education  2022 [employer].

## 2020-07-11 LAB — LIPID PANEL
Chol/HDL Ratio: 3.7 ratio (ref 0.0–4.4)
Cholesterol, Total: 248 mg/dL — ABNORMAL HIGH (ref 100–199)
HDL: 67 mg/dL (ref >39)
LDL Chol Calc (NIH): 140 mg/dL — ABNORMAL HIGH (ref 0–99)
Triglycerides: 233 mg/dL — ABNORMAL HIGH (ref 0–149)
VLDL Cholesterol Cal: 41 mg/dL — ABNORMAL HIGH (ref 5–40)

## 2020-07-11 LAB — COMPREHENSIVE METABOLIC PANEL
ALT: 15 IU/L (ref 0–32)
AST: 18 IU/L (ref 0–40)
Albumin/Globulin Ratio: 1.8 (ref 1.2–2.2)
Albumin: 4.3 g/dL (ref 3.8–4.8)
Alkaline Phosphatase: 105 IU/L (ref 44–121)
BUN/Creatinine Ratio: 15 (ref 12–28)
BUN: 9 mg/dL (ref 8–27)
Bilirubin Total: <0.2 mg/dL (ref 0.0–1.2)
CO2: 24 mmol/L (ref 20–29)
Calcium: 10.2 mg/dL (ref 8.7–10.3)
Chloride: 102 mmol/L (ref 96–106)
Creatinine, Ser: 0.59 mg/dL (ref 0.57–1.00)
Globulin, Total: 2.4 g/dL (ref 1.5–4.5)
Glucose: 83 mg/dL (ref 65–99)
Potassium: 4.3 mmol/L (ref 3.5–5.2)
Sodium: 142 mmol/L (ref 134–144)
Total Protein: 6.7 g/dL (ref 6.0–8.5)
eGFR: 98 mL/min/{1.73_m2} (ref >59)

## 2020-07-15 ENCOUNTER — Telehealth: Payer: Self-pay

## 2020-07-15 NOTE — Telephone Encounter (Signed)
-----   Message from Virginia Crews, MD sent at 07/11/2020 12:33 PM EDT ----- Normal labs, except for high cholesterol. The 10-year ASCVD (heart disease and stroke) risk score Mikey Bussing DC Jr., et al., 2013) is: 8.2%, which is high.  I would recommend a cholesterol medicine to lower heart disease risk.  If patient is amenable, try Crestor 5 mg daily.  Okay to send in 90-day supply.  She can take this with co-Q10 100 mg daily OTC for minimizing possible side effects like myalgias.

## 2020-07-17 NOTE — Telephone Encounter (Signed)
Noted  

## 2020-07-24 ENCOUNTER — Other Ambulatory Visit: Payer: Self-pay

## 2020-07-24 ENCOUNTER — Ambulatory Visit
Admission: RE | Admit: 2020-07-24 | Discharge: 2020-07-24 | Disposition: A | Discharge status: Home / Self Care | Payer: Medicare HMO | Source: Ambulatory Visit | Attending: Family Medicine | Admitting: Family Medicine

## 2020-07-24 DIAGNOSIS — Z1231 Encounter for screening mammogram for malignant neoplasm of breast: Secondary | ICD-10-CM

## 2020-07-24 DIAGNOSIS — Z780 Asymptomatic menopausal state: Secondary | ICD-10-CM | POA: Insufficient documentation

## 2020-07-24 DIAGNOSIS — Z0000 Encounter for general adult medical examination without abnormal findings: Secondary | ICD-10-CM | POA: Diagnosis not present

## 2020-07-24 DIAGNOSIS — M85852 Other specified disorders of bone density and structure, left thigh: Secondary | ICD-10-CM | POA: Diagnosis not present

## 2020-09-10 ENCOUNTER — Encounter: Payer: Self-pay | Admitting: Family Medicine

## 2020-09-11 MED ORDER — ROSUVASTATIN CALCIUM 5 MG PO TABS: 5.0000 mg | ORAL_TABLET | Freq: Every day | ORAL | 3 refills | Status: DC

## 2020-10-17 ENCOUNTER — Encounter: Payer: Self-pay | Admitting: Family Medicine

## 2020-11-05 ENCOUNTER — Other Ambulatory Visit (INDEPENDENT_AMBULATORY_CARE_PROVIDER_SITE_OTHER): Payer: Medicare HMO

## 2020-11-05 ENCOUNTER — Other Ambulatory Visit: Payer: Self-pay

## 2020-11-05 ENCOUNTER — Ambulatory Visit (INDEPENDENT_AMBULATORY_CARE_PROVIDER_SITE_OTHER): Payer: Medicare HMO

## 2020-11-05 ENCOUNTER — Telehealth: Payer: Self-pay

## 2020-11-05 DIAGNOSIS — R399 Unspecified symptoms and signs involving the genitourinary system: Secondary | ICD-10-CM

## 2020-11-05 DIAGNOSIS — Z23 Encounter for immunization: Secondary | ICD-10-CM | POA: Diagnosis not present

## 2020-11-05 LAB — POCT URINALYSIS DIPSTICK
Appearance: NORMAL
Bilirubin, UA: NEGATIVE
Blood, UA: NEGATIVE
Glucose, UA: NEGATIVE
Ketones, UA: NEGATIVE
Leukocytes, UA: NEGATIVE
Nitrite, UA: NEGATIVE
Protein, UA: NEGATIVE
Spec Grav, UA: 1.015 (ref 1.010–1.025)
Urobilinogen, UA: 0.2 E.U./dL
pH, UA: 6 (ref 5.0–8.0)

## 2020-11-05 NOTE — Telephone Encounter (Signed)
Copied from [place] [id number]. Topic: General - Inquiry >> Nov 05, 2020  9:58 AM Katie Bass wrote: Reason for CRM: Pt is coming in today for a flu shot at 3:20 / pt mentioned having a UTI and if at that time she can leave a urine sample/ please advise

## 2020-11-06 LAB — SPECIMEN STATUS REPORT

## 2020-11-07 LAB — URINE CULTURE

## 2020-12-19 ENCOUNTER — Encounter: Payer: Self-pay | Admitting: Family Medicine

## 2020-12-19 ENCOUNTER — Telehealth (INDEPENDENT_AMBULATORY_CARE_PROVIDER_SITE_OTHER): Payer: Medicare HMO | Admitting: Family Medicine

## 2020-12-19 VITALS — Ht 62.0 in | Wt 160.0 lb

## 2020-12-19 DIAGNOSIS — J069 Acute upper respiratory infection, unspecified: Secondary | ICD-10-CM | POA: Diagnosis not present

## 2020-12-19 NOTE — Progress Notes (Signed)
MyChart Telephone Visit    Virtual Visit via Video Note   This visit type was conducted due to national recommendations for restrictions regarding the COVID-19 Pandemic (e.g. social distancing) in an effort to limit this patient's exposure and mitigate transmission in our community. This patient is at least at moderate risk for complications without adequate follow up. This format is felt to be most appropriate for this patient at this time. Physical exam was limited by quality of the video and audio technology used for the visit.    Patient location: home Provider location: [place] involved in the visit: patient, provider   I discussed the limitations of evaluation and management by telemedicine and the availability of in person appointments. The patient expressed understanding and agreed to proceed.  Patient: Katie Bass   DOB: 11-23-1950   70 y.o. Female  MRN: [id number] Visit Date: 12/19/2020  Today's healthcare provider: Lavon Paganini, MD   Chief Complaint  Patient presents with   URI   Subjective    URI  This is a new problem. The current episode started in the past 7 days (sunday). The problem has been gradually worsening. There has been no fever. Associated symptoms include congestion, coughing, rhinorrhea, sinus pain and a sore throat. Pertinent negatives include no chest pain, ear pain, nausea, plugged ear sensation, vomiting or wheezing. She has tried decongestant and antihistamine for the symptoms. The treatment provided mild relief.   Taking delsym for cough and mucinex D  Has not taken a home covid test  Has had COVID vaccines and flu shot this year.  Medications: Outpatient Medications Prior to Visit  Medication Sig   Ergocalciferol (VITAMIN D2) 2000 UNITS TABS Take 1 tablet by mouth daily.   fluticasone (FLONASE) 50 MCG/ACT nasal spray Use 2 spray(s) in each nostril once daily   Multiple Vitamin tablet Take 1 tablet by mouth  daily.   Omega-3 Fatty Acids (FISH OIL) 1000 MG CAPS Take by mouth daily.   rosuvastatin (CRESTOR) 5 MG tablet Take 1 tablet (5 mg total) by mouth daily.   No facility-administered medications prior to visit.    Review of Systems  HENT:  Positive for congestion, postnasal drip, rhinorrhea, sinus pain, sore throat and voice change. Negative for ear pain.   Respiratory:  Positive for cough. Negative for shortness of breath and wheezing.   Cardiovascular:  Negative for chest pain and palpitations.  Gastrointestinal:  Negative for nausea and vomiting.      Objective    Ht 5\' 2"  (1.575 m)   Wt 160 lb (72.6 kg)   BMI 29.26 kg/m    Physical Exam     Assessment & Plan     1. Viral URI with cough - symptoms and exam c/w viral URI - no evidence of strep pharyngitis, CAP, AOM, bacterial sinusitis, or other bacterial infection - she is outside the antiviral window and declines flu and covid test at this time - discussed symptomatic management, natural course, and return precautions   - if still with sinusitis symptoms at day 7-10 consider abx treatment, but discussed that this does not need abx currently - no h/o lung disease so no need for CXR at this time  No follow-ups on file.     I discussed the assessment and treatment plan with the patient. The patient was provided an opportunity to ask questions and all were answered. The patient agreed with the plan and demonstrated an understanding of the instructions.   The  patient was advised to call back or seek an in-person evaluation if the symptoms worsen or if the condition fails to improve as anticipated.  I provided 12 minutes of non-face-to-face time during this encounter.  I, Katie Paganini, MD, have reviewed all documentation for this visit. The documentation on 12/19/20 for the exam, diagnosis, procedures, and orders are all accurate and complete.   Nikitas Davtyan, Dionne Bucy, MD, MPH [facility]

## 2020-12-22 ENCOUNTER — Encounter: Payer: Self-pay | Admitting: Family Medicine

## 2020-12-22 DIAGNOSIS — J019 Acute sinusitis, unspecified: Secondary | ICD-10-CM

## 2020-12-22 MED ORDER — AMOXICILLIN-POT CLAVULANATE 875-125 MG PO TABS: 1.0000 | ORAL_TABLET | Freq: Two times a day (BID) | ORAL | 0 refills | Status: AC

## 2021-03-10 ENCOUNTER — Encounter: Payer: Self-pay | Admitting: Physician Assistant

## 2021-03-10 ENCOUNTER — Ambulatory Visit (INDEPENDENT_AMBULATORY_CARE_PROVIDER_SITE_OTHER): Payer: Medicare HMO | Admitting: Physician Assistant

## 2021-03-10 ENCOUNTER — Other Ambulatory Visit: Payer: Self-pay

## 2021-03-10 VITALS — BP 155/91 | HR 80 | Resp 16 | Wt 165.6 lb

## 2021-03-10 DIAGNOSIS — B9689 Other specified bacterial agents as the cause of diseases classified elsewhere: Secondary | ICD-10-CM | POA: Diagnosis not present

## 2021-03-10 DIAGNOSIS — J019 Acute sinusitis, unspecified: Secondary | ICD-10-CM | POA: Diagnosis not present

## 2021-03-10 MED ORDER — FLUTICASONE PROPIONATE 50 MCG/ACT NA SUSP: 2.0000 | Freq: Every day | NASAL | 6 refills | Status: DC

## 2021-03-10 MED ORDER — AMOXICILLIN-POT CLAVULANATE 875-125 MG PO TABS: 1.0000 | ORAL_TABLET | Freq: Two times a day (BID) | ORAL | 0 refills | Status: DC

## 2021-03-10 NOTE — Progress Notes (Signed)
Established patient visit   Patient: Katie Bass   DOB: 01-May-1950   71 y.o. Female  MRN: [id number] Visit Date: 03/10/2021  Today's healthcare provider: Mardene Speak, PA-C   Chief Complaint  Patient presents with   Sinus Problem   Subjective     Sinus Problem This is a new problem. The current episode started 1 to 4 weeks ago. The problem has been gradually worsening since onset. There has been low grade fever. Associated symptoms include congestion, coughing, diaphoresis, ear pain (left side), headaches, a hoarse voice, neck pain, shortness of breath, sinus pressure, sneezing, a sore throat and swollen glands. Pertinent negatives include no chills. Treatments tried: 12hr Sudafed, Mucinex D, Indoor/Outdoor allergy and Flonase. The treatment provided mild relief.   Medications: Outpatient Medications Prior to Visit  Medication Sig   Ergocalciferol (VITAMIN D2) 2000 UNITS TABS Take 1 tablet by mouth daily.   fluticasone (FLONASE) 50 MCG/ACT nasal spray Use 2 spray(s) in each nostril once daily   Multiple Vitamin tablet Take 1 tablet by mouth daily.   Omega-3 Fatty Acids (FISH OIL) 1000 MG CAPS Take by mouth daily.   rosuvastatin (CRESTOR) 5 MG tablet Take 1 tablet (5 mg total) by mouth daily.   No facility-administered medications prior to visit.    Review of Systems  Constitutional:  Positive for diaphoresis. Negative for chills.  HENT:  Positive for congestion, ear pain (left side), facial swelling (left side), sinus pressure, sneezing and sore throat. Negative for tinnitus.   Respiratory:  Positive for cough (keeps her awake at night) and shortness of breath.   Musculoskeletal:  Positive for neck pain.  Neurological:  Positive for headaches.     Objective    BP (!) 155/91 (BP Location: Left Arm, Patient Position: Sitting, Cuff Size: Large)    Pulse 80    Resp 16    Wt 165 lb 9.6 oz (75.1 kg)    BMI 30.29 kg/m    Physical Exam Vitals and nursing note reviewed.   Constitutional:      General: She is awake. She is in acute distress.     Appearance: She is well-groomed. She is obese. She is ill-appearing.  HENT:     Head: Atraumatic.     Jaw: No trismus, tenderness, swelling or pain on movement.      Comments: Tenderness on palpation,  maxillary sinus pressure bilaterally noted.    Ears:     Comments: Fluids behind TMs    Nose: Congestion present.     Mouth/Throat:     Mouth: Mucous membranes are moist.     Pharynx: No oropharyngeal exudate or posterior oropharyngeal erythema.  Eyes:     General: Lids are normal. Gaze aligned appropriately.        Right eye: No discharge.        Left eye: No discharge.     Conjunctiva/sclera:     Right eye: No exudate.    Left eye: No exudate. Cardiovascular:     Rate and Rhythm: Normal rate and regular rhythm.     Pulses: Normal pulses.     Heart sounds: Normal heart sounds.  Pulmonary:     Effort: Pulmonary effort is normal.     Breath sounds: Normal breath sounds.  Musculoskeletal:     Cervical back: Normal range of motion. Tenderness (due to lymphadenitis) present.  Lymphadenopathy:     Cervical: Cervical adenopathy present.  Neurological:     Mental Status: She is alert.  Psychiatric:        Behavior: Behavior is cooperative.     Assessment & Plan     Acute rhinosinusitis - symptoms and exam c/w sinusitis   - no evidence of AOM, CAP, strep pharyngitis, or other infection - given duration of symptoms, suspect bacterial etiology - will treat with Augmentin x 10 d - discussed symptomatic management (Rx flonase, decongestants, etc), natural course, and return precautions      I discussed the assessment and treatment plan with the patient. The patient was provided an opportunity to ask questions and all were answered. The patient agreed with the plan and demonstrated an understanding of the instructions.   The patient was advised to call back or seek an in-person evaluation if the symptoms  worsen or if the condition fails to improve as anticipated.  I,Kathleen J Wolford,acting as a Education administrator for [employer], PA-C.,have documented all relevant documentation on the behalf of Mardene Speak, PA-C,as directed by  [employer], PA-C while in the presence of [employer], PA-C.   Mardene Speak, PA-C  [facility] [phone] (phone) [phone] (fax)  [place]

## 2021-03-24 DIAGNOSIS — K029 Dental caries, unspecified: Secondary | ICD-10-CM | POA: Diagnosis not present

## 2021-07-16 ENCOUNTER — Ambulatory Visit (INDEPENDENT_AMBULATORY_CARE_PROVIDER_SITE_OTHER): Payer: Medicare HMO

## 2021-07-16 VITALS — Wt 165.0 lb

## 2021-07-16 DIAGNOSIS — Z1231 Encounter for screening mammogram for malignant neoplasm of breast: Secondary | ICD-10-CM

## 2021-07-16 DIAGNOSIS — Z0000 Encounter for general adult medical examination without abnormal findings: Secondary | ICD-10-CM | POA: Diagnosis not present

## 2021-07-16 NOTE — Patient Instructions (Signed)
Katie Bass , Thank you for taking time to come for your Medicare Wellness Visit. I appreciate your ongoing commitment to your health goals. Please review the following plan we discussed and let me know if I can assist you in the future.   Screening recommendations/referrals: Colonoscopy: 06/07/13 Mammogram: 07/24/20, referral sent Bone Density: 07/24/20 Recommended yearly ophthalmology/optometry visit for glaucoma screening and checkup Recommended yearly dental visit for hygiene and checkup  Vaccinations: Influenza vaccine: 11/05/20 Pneumococcal vaccine: 12/02/17 Tdap vaccine: n/d Shingles vaccine: n/d   Covid-19:02/25/19, 03/26/19, 11/13/20  Advanced directives: no  Conditions/risks identified: none  Next appointment: Follow up in one year for your annual wellness visit 07/20/22 @ 8:15 am by phone   Preventive Care 65 Years and Older, Female Preventive care refers to lifestyle choices and visits with your health care provider that can promote health and wellness. What does preventive care include? A yearly physical exam. This is also called an annual well check. Dental exams once or twice a year. Routine eye exams. Ask your health care provider how often you should have your eyes checked. Personal lifestyle choices, including: Daily care of your teeth and gums. Regular physical activity. Eating a healthy diet. Avoiding tobacco and drug use. Limiting alcohol use. Practicing safe sex. Taking low-dose aspirin every day. Taking vitamin and mineral supplements as recommended by your health care provider. What happens during an annual well check? The services and screenings done by your health care provider during your annual well check will depend on your age, overall health, lifestyle risk factors, and family history of disease. Counseling  Your health care provider may ask you questions about your: Alcohol use. Tobacco use. Drug use. Emotional well-being. Home and relationship  well-being. Sexual activity. Eating habits. History of falls. Memory and ability to understand (cognition). Work and work Statistician. Reproductive health. Screening  You may have the following tests or measurements: Height, weight, and BMI. Blood pressure. Lipid and cholesterol levels. These may be checked every 5 years, or more frequently if you are over 74 years old. Skin check. Lung cancer screening. You may have this screening every year starting at age 17 if you have a 30-pack-year history of smoking and currently smoke or have quit within the past 15 years. Fecal occult blood test (FOBT) of the stool. You may have this test every year starting at age 10. Flexible sigmoidoscopy or colonoscopy. You may have a sigmoidoscopy every 5 years or a colonoscopy every 10 years starting at age 37. Hepatitis C blood test. Hepatitis B blood test. Sexually transmitted disease (STD) testing. Diabetes screening. This is done by checking your blood sugar (glucose) after you have not eaten for a while (fasting). You may have this done every 1-3 years. Bone density scan. This is done to screen for osteoporosis. You may have this done starting at age 63. Mammogram. This may be done every 1-2 years. Talk to your health care provider about how often you should have regular mammograms. Talk with your health care provider about your test results, treatment options, and if necessary, the need for more tests. Vaccines  Your health care provider may recommend certain vaccines, such as: Influenza vaccine. This is recommended every year. Tetanus, diphtheria, and acellular pertussis (Tdap, Td) vaccine. You may need a Td booster every 10 years. Zoster vaccine. You may need this after age 22. Pneumococcal 13-valent conjugate (PCV13) vaccine. One dose is recommended after age 68. Pneumococcal polysaccharide (PPSV23) vaccine. One dose is recommended after age 9. Talk to your  health care provider about which  screenings and vaccines you need and how often you need them. This information is not intended to replace advice given to you by your health care provider. Make sure you discuss any questions you have with your health care provider. Document Released: 01/24/2015 Document Revised: 09/17/2015 Document Reviewed: 10/29/2014 Elsevier Interactive Patient Education  2017 [employer] Prevention in the Home Falls can cause injuries. They can happen to people of all ages. There are many things you can do to make your home safe and to help prevent falls. What can I do on the outside of my home? Regularly fix the edges of walkways and driveways and fix any cracks. Remove anything that might make you trip as you walk through a door, such as a raised step or threshold. Trim any bushes or trees on the path to your home. Use bright outdoor lighting. Clear any walking paths of anything that might make someone trip, such as rocks or tools. Regularly check to see if handrails are loose or broken. Make sure that both sides of any steps have handrails. Any raised decks and porches should have guardrails on the edges. Have any leaves, snow, or ice cleared regularly. Use sand or salt on walking paths during winter. Clean up any spills in your garage right away. This includes oil or grease spills. What can I do in the bathroom? Use night lights. Install grab bars by the toilet and in the tub and shower. Do not use towel bars as grab bars. Use non-skid mats or decals in the tub or shower. If you need to sit down in the shower, use a plastic, non-slip stool. Keep the floor dry. Clean up any water that spills on the floor as soon as it happens. Remove soap buildup in the tub or shower regularly. Attach bath mats securely with double-sided non-slip rug tape. Do not have throw rugs and other things on the floor that can make you trip. What can I do in the bedroom? Use night lights. Make sure that you have a  light by your bed that is easy to reach. Do not use any sheets or blankets that are too big for your bed. They should not hang down onto the floor. Have a firm chair that has side arms. You can use this for support while you get dressed. Do not have throw rugs and other things on the floor that can make you trip. What can I do in the kitchen? Clean up any spills right away. Avoid walking on wet floors. Keep items that you use a lot in easy-to-reach places. If you need to reach something above you, use a strong step stool that has a grab bar. Keep electrical cords out of the way. Do not use floor polish or wax that makes floors slippery. If you must use wax, use non-skid floor wax. Do not have throw rugs and other things on the floor that can make you trip. What can I do with my stairs? Do not leave any items on the stairs. Make sure that there are handrails on both sides of the stairs and use them. Fix handrails that are broken or loose. Make sure that handrails are as long as the stairways. Check any carpeting to make sure that it is firmly attached to the stairs. Fix any carpet that is loose or worn. Avoid having throw rugs at the top or bottom of the stairs. If you do have throw rugs, attach them  to the floor with carpet tape. Make sure that you have a light switch at the top of the stairs and the bottom of the stairs. If you do not have them, ask someone to add them for you. What else can I do to help prevent falls? Wear shoes that: Do not have high heels. Have rubber bottoms. Are comfortable and fit you well. Are closed at the toe. Do not wear sandals. If you use a stepladder: Make sure that it is fully opened. Do not climb a closed stepladder. Make sure that both sides of the stepladder are locked into place. Ask someone to hold it for you, if possible. Clearly mark and make sure that you can see: Any grab bars or handrails. First and last steps. Where the edge of each step  is. Use tools that help you move around (mobility aids) if they are needed. These include: Canes. Walkers. Scooters. Crutches. Turn on the lights when you go into a dark area. Replace any light bulbs as soon as they burn out. Set up your furniture so you have a clear path. Avoid moving your furniture around. If any of your floors are uneven, fix them. If there are any pets around you, be aware of where they are. Review your medicines with your doctor. Some medicines can make you feel dizzy. This can increase your chance of falling. Ask your doctor what other things that you can do to help prevent falls. This information is not intended to replace advice given to you by your health care provider. Make sure you discuss any questions you have with your health care provider. Document Released: 10/24/2008 Document Revised: 06/05/2015 Document Reviewed: 02/01/2014 Elsevier Interactive Patient Education  2017 [employer].

## 2021-07-16 NOTE — Progress Notes (Signed)
Virtual Visit via Telephone Note  I connected with  Katie Bass on 07/16/21 at  8:15 AM EDT by telephone and verified that I am speaking with the correct person using two identifiers.  Location: Patient: home Provider: [facility] Persons participating in the virtual visit: [place]   I discussed the limitations, risks, security and privacy concerns of performing an evaluation and management service by telephone and the availability of in person appointments. The patient expressed understanding and agreed to proceed.  Interactive audio and video telecommunications were attempted between this nurse and patient, however failed, due to patient having technical difficulties OR patient did not have access to video capability.  We continued and completed visit with audio only.  Some vital signs may be absent or patient reported.   Dionisio David, LPN  Subjective:   Katie Bass is a 71 y.o. female who presents for Medicare Annual (Subsequent) preventive examination.  Review of Systems           Objective:    There were no vitals filed for this visit. There is no height or weight on file to calculate BMI.     12/05/2018    1:25 PM 12/02/2017   12:49 PM 12/29/2015    1:59 PM  Advanced Directives  Does Patient Have a Medical Advance Directive? No Yes Yes  Type of Corporate treasurer of [place];Living will Living will  Copy of [place] in Chart?  No - copy requested   Would patient like information on creating a medical advance directive? Yes (ED - Information included in AVS)      Current Medications (verified) Outpatient Encounter Medications as of 07/16/2021  Medication Sig   amoxicillin (AMOXIL) 875 MG tablet    amoxicillin-clavulanate (AUGMENTIN) 875-125 MG tablet Take 1 tablet by mouth 2 (two) times daily.   chlorhexidine (PERIDEX) 0.12 % solution SMARTSIG:By Mouth   Ergocalciferol (VITAMIN D2) 2000 UNITS TABS Take 1  tablet by mouth daily.   fluticasone (FLONASE) 50 MCG/ACT nasal spray Place 2 sprays into both nostrils daily.   Multiple Vitamin tablet Take 1 tablet by mouth daily.   Omega-3 Fatty Acids (FISH OIL) 1000 MG CAPS Take by mouth daily.   oxyCODONE (OXY IR/ROXICODONE) 5 MG immediate release tablet    rosuvastatin (CRESTOR) 5 MG tablet Take 1 tablet (5 mg total) by mouth daily.   No facility-administered encounter medications on file as of 07/16/2021.    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  Tobacco Use   Smoking status: Former   Smokeless tobacco: Never   Tobacco comments:    smoked <1pack if cigarettesper day, for 1 year in her 72s  Vaping Use   Vaping Use: Never used  Substance and Sexual Activity   Alcohol use: No  Drug use: No   Sexual activity: Not on file  Other Topics Concern   Not on file  Social History Narrative   Not on file   Social Determinants of Health   Financial Resource Strain: Low Risk  (12/02/2017)   Overall Financial Resource Strain (CARDIA)    Difficulty of Paying Living Expenses: Not hard at all  Food Insecurity: No Food Insecurity (12/02/2017)   Hunger Vital Sign    Worried About Running Out of Food in the Last Year: Never true    Ran Out of Food in the Last Year: Never true  Transportation Needs: No Transportation Needs (12/02/2017)   PRAPARE -  Hydrologist (Medical): No    Lack of Transportation (Non-Medical): No  Physical Activity: Inactive (12/02/2017)   Exercise Vital Sign    Days of Exercise per Week: 0 days    Minutes of Exercise per Session: 0 min  Stress: No Stress Concern Present (12/02/2017)   [place]    Feeling of Stress : Only a little  Social Connections: Unknown (12/02/2017)   Social Connection and Isolation Panel [NHANES]    Frequency of Communication with Friends and Family: Patient refused    Frequency of Social Gatherings with Friends and Family: Patient refused    Attends Religious Services: Patient refused    Marine scientist or Organizations: Patient refused    Attends Music therapist: Patient refused    Marital Status: Patient refused    Tobacco Counseling Counseling given: Not Answered Tobacco comments: smoked <1pack if cigarettesper day, for 1 year in her 35s   Clinical Intake:  Pre-visit preparation completed: Yes  Pain : No/denies pain     Nutritional Risks: None Diabetes: No  How often do you need to have someone help you when you read instructions, pamphlets, or other written materials from your doctor or pharmacy?: 1 - Never  Diabetic?no  Interpreter Needed?: No  Information entered by :: Kirke Shaggy, LPN   Activities of Daily Living    07/13/2021    3:48 PM  In your present state of health, do you have any difficulty performing the following activities:  Vision? 0  Difficulty concentrating or making decisions? 0  Walking or climbing stairs? 0  Dressing or bathing? 0  Doing errands, shopping? 0  Preparing Food and eating ? N  Using the Toilet? N  In the past six months, have you accidently leaked urine? N  Do you have problems with loss of bowel control? N  Managing your Medications? N  Managing your Finances? N  Housekeeping or managing your  Housekeeping? N    Patient Care Team: [name], PA-C as PCP - General (Physician Assistant)  Indicate any recent Medical Services you may have received from other than [facility] providers in the past year (date may be approximate).     Assessment:   This is a routine wellness examination for Katie Bass.  Hearing/Vision screen No results found.  Dietary issues and exercise activities discussed:     Goals Addressed   None    Depression Screen    07/10/2020    3:54 PM 12/05/2018    1:28 PM 12/02/2017   12:53 PM 12/02/2017   12:51 PM  PHQ 2/9 Scores  PHQ - 2 Score 0 0 0 0  PHQ- 9 Score 0  0     Fall Risk    07/13/2021    3:48 PM  07/10/2020    4:10 PM 12/05/2018    1:28 PM 12/02/2017   12:51 PM 02/08/2017    2:19 PM  Fall Risk   Falls in the past year? 0 0 0 0 No  Number falls in past yr:  0 0    Injury with Fall?  0 0      FALL RISK PREVENTION PERTAINING TO THE HOME:  Any stairs in or around the home? No  If so, are there any without handrails? No  Home free of loose throw rugs in walkways, pet beds, electrical cords, etc? Yes  Adequate lighting in your home to reduce risk of falls? No   ASSISTIVE DEVICES UTILIZED TO PREVENT FALLS:  Life alert? No  Use of a cane, walker or w/c? No  Grab bars in the bathroom? Yes  Shower chair or bench in shower? No  Elevated toilet seat or a handicapped toilet? Yes     Cognitive Function: declined to test 2023        Immunizations Immunization History  Administered Date(s) Administered   Fluad Quad(high Dose 65+) 10/06/2018, 09/18/2019, 11/05/2020   Influenza Whole 10/20/2009   Influenza, High Dose Seasonal PF 10/18/2016, 10/19/2017   Influenza,inj,Quad PF,6+ Mos 10/19/2012, 10/04/2013, 10/10/2014, 10/22/2015   MODERNA COVID-19 SARS-COV-2 PEDS BIVALENT BOOSTER 6Y-11Y 11/13/2020   Moderna Sars-Covid-2 Vaccination 02/25/2019, 03/26/2019   Pneumococcal Conjugate-13 12/02/2017    TDAP status: Due, Education has been  provided regarding the importance of this vaccine. Advised may receive this vaccine at local pharmacy or Health Dept. Aware to provide a copy of the vaccination record if obtained from local pharmacy or Health Dept. Verbalized acceptance and understanding.  Flu Vaccine status: Up to date  Pneumococcal vaccine status: Due, Education has been provided regarding the importance of this vaccine. Advised may receive this vaccine at local pharmacy or Health Dept. Aware to provide a copy of the vaccination record if obtained from local pharmacy or Health Dept. Verbalized acceptance and understanding.  Covid-19 vaccine status: Completed vaccines  Qualifies for Shingles Vaccine? Yes   Zostavax completed No   Shingrix Completed?: No.    Education has been provided regarding the importance of this vaccine. Patient has been advised to call insurance company to determine out of pocket expense if they have not yet received this vaccine. Advised may also receive vaccine at local pharmacy or Health Dept. Verbalized acceptance and understanding.  Screening Tests Health Maintenance  Topic Date Due   TETANUS/TDAP  Never done   Zoster Vaccines- Shingrix (1 of 2) Never done   Pneumonia Vaccine 31+ Years old (2 - PPSV23 if available, else PCV20) 12/03/2018   COVID-19 Vaccine (4 - Booster for Moderna series) 01/08/2021   INFLUENZA VACCINE  08/11/2021   MAMMOGRAM  07/25/2022   COLONOSCOPY (Pts 45-44yr Insurance coverage will need to be confirmed)  06/08/2023   DEXA SCAN  Completed   Hepatitis C Screening  Completed   HPV VACCINES  Aged Out    Health Maintenance  Health Maintenance Due  Topic Date Due   TETANUS/TDAP  Never done   Zoster Vaccines- Shingrix (1 of 2) Never done   Pneumonia Vaccine 70 Years old (2 - PPSV23 if available, else PCV20) 12/03/2018   COVID-19 Vaccine (4 - Booster for Moderna series) 01/08/2021    Colorectal cancer screening: Type of screening: Colonoscopy. Completed 06/07/13.  Repeat every 10 years  Mammogram status: Completed 07/24/20. Repeat every year  Bone Density status: Completed 07/24/20. Results reflect: Bone density results: OSTEOPENIA. Repeat  every 5 years.  Lung Cancer Screening: (Low Dose CT Chest recommended if Age 29-80 years, 30 pack-year currently smoking OR have quit w/in 15years.) does not qualify.    Additional Screening:  Hepatitis C Screening: does qualify; Completed 12/29/15  Vision Screening: Recommended annual ophthalmology exams for early detection of glaucoma and other disorders of the eye. Is the patient up to date with their annual eye exam?  Yes  Who is the provider or what is the name of the office in which the patient attends annual eye exams? [facility] If pt is not established with a provider, would they like to be referred to a provider to establish care? No .   Dental Screening: Recommended annual dental exams for proper oral hygiene  Community Resource Referral / Chronic Care Management: CRR required this visit?  No   CCM required this visit?  No      Plan:     I have personally reviewed and noted the following in the patient's chart:   Medical and social history Use of alcohol, tobacco or illicit drugs  Current medications and supplements including opioid prescriptions.  Functional ability and status Nutritional status Physical activity Advanced directives List of other physicians Hospitalizations, surgeries, and ER visits in previous 12 months Vitals Screenings to include cognitive, depression, and falls Referrals and appointments  In addition, I have reviewed and discussed with patient certain preventive protocols, quality metrics, and best practice recommendations. A written personalized care plan for preventive services as well as general preventive health recommendations were provided to patient.     Dionisio David, LPN   [birth year]1914   Nurse Notes: none

## 2021-09-15 ENCOUNTER — Other Ambulatory Visit: Payer: Self-pay | Admitting: Family Medicine

## 2021-09-22 ENCOUNTER — Telehealth: Payer: Self-pay | Admitting: Family Medicine

## 2021-09-22 NOTE — Telephone Encounter (Signed)
Patient is wanting an order for a mammogram please.

## 2021-09-23 ENCOUNTER — Ambulatory Visit (INDEPENDENT_AMBULATORY_CARE_PROVIDER_SITE_OTHER): Payer: Medicare HMO

## 2021-09-23 DIAGNOSIS — Z23 Encounter for immunization: Secondary | ICD-10-CM | POA: Diagnosis not present

## 2021-10-07 IMAGING — MG MM DIGITAL SCREENING BILAT W/ TOMO AND CAD
8 series · 8 of 24 positions shown · non-contrast
Comparison: Previous exam(s).

CLINICAL DATA: Screening.

EXAM:
DIGITAL SCREENING BILATERAL MAMMOGRAM WITH TOMOSYNTHESIS AND CAD
TECHNIQUE: Bilateral screening digital craniocaudal and mediolateral oblique
mammograms were obtained. Bilateral screening digital breast
tomosynthesis was performed. The images were evaluated with
computer-aided detection.

[L MLO synth-2D]
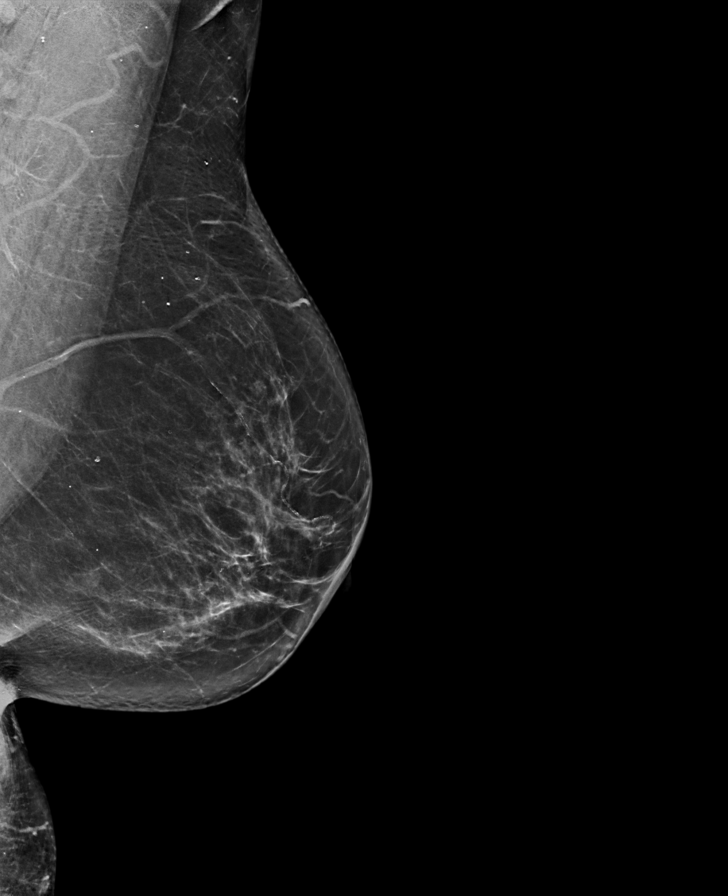

[R MLO synth-2D]
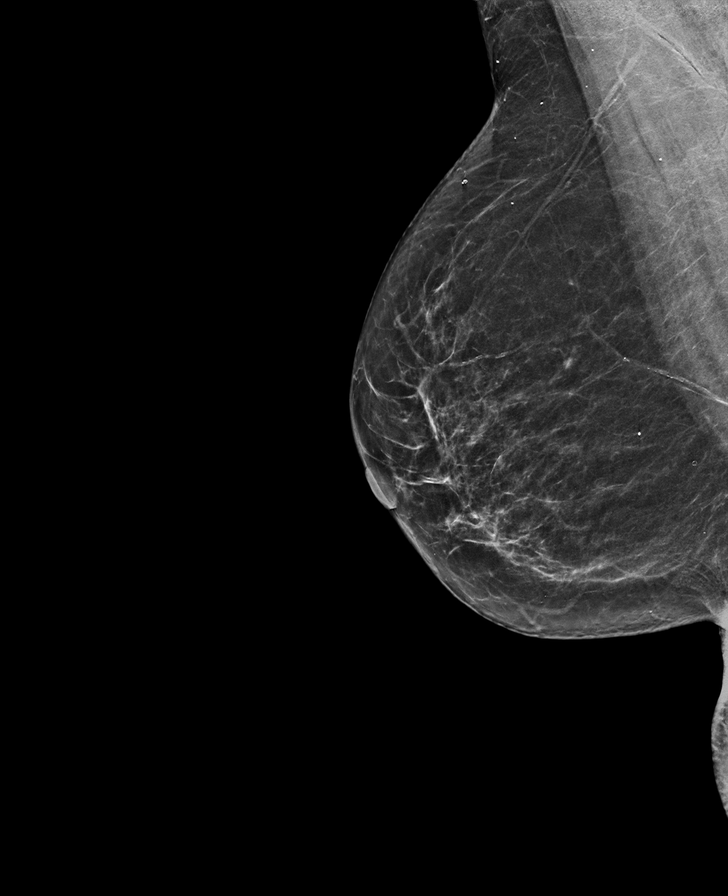

[L CC synth-2D]
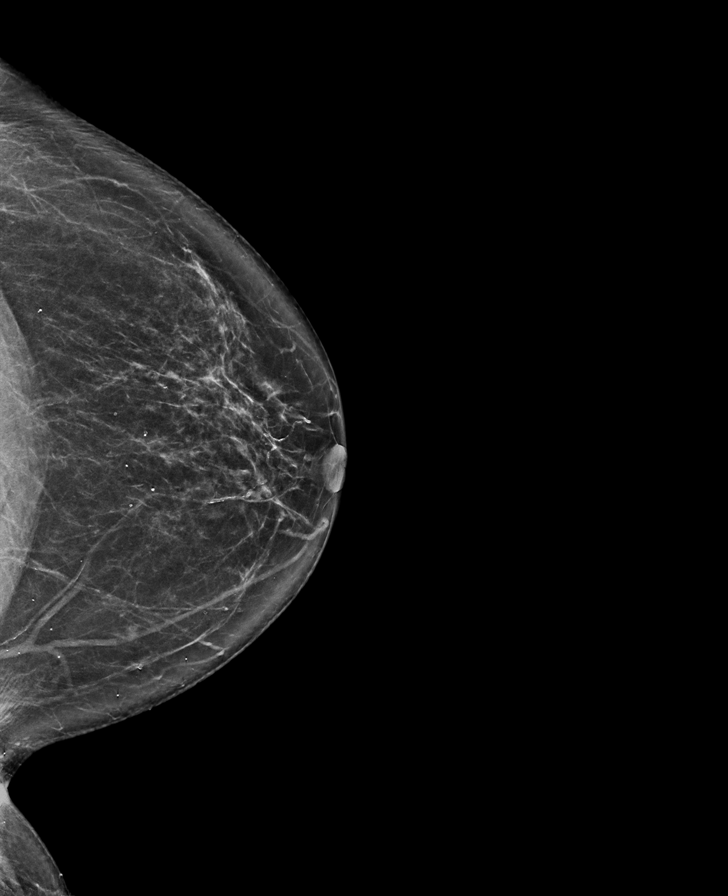

[R CC synth-2D]
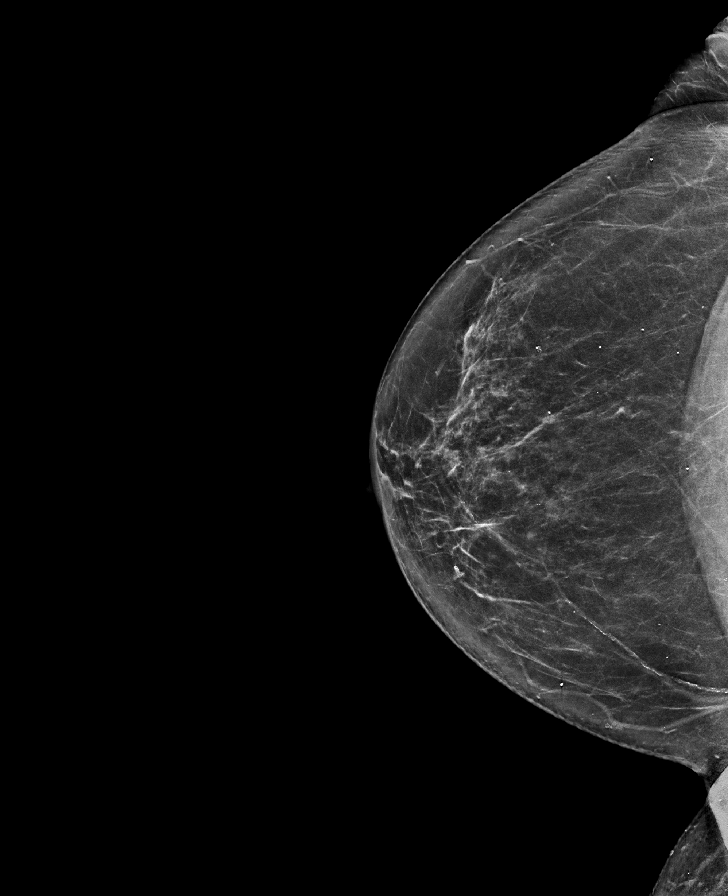

[R MLO tomo · tomo slice 36/71.0]
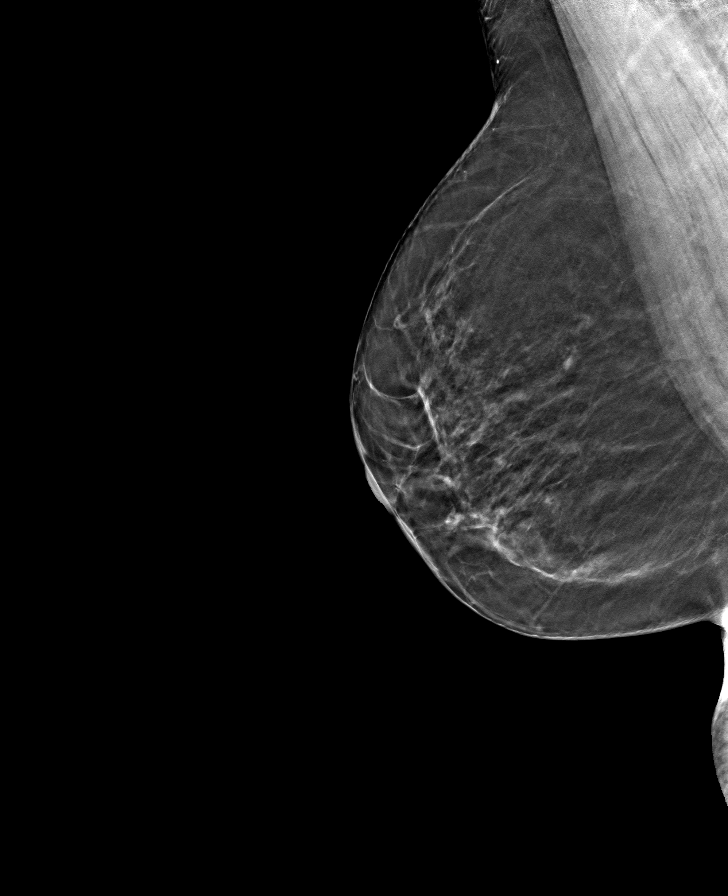

[L CC tomo · tomo slice 39/76.0]
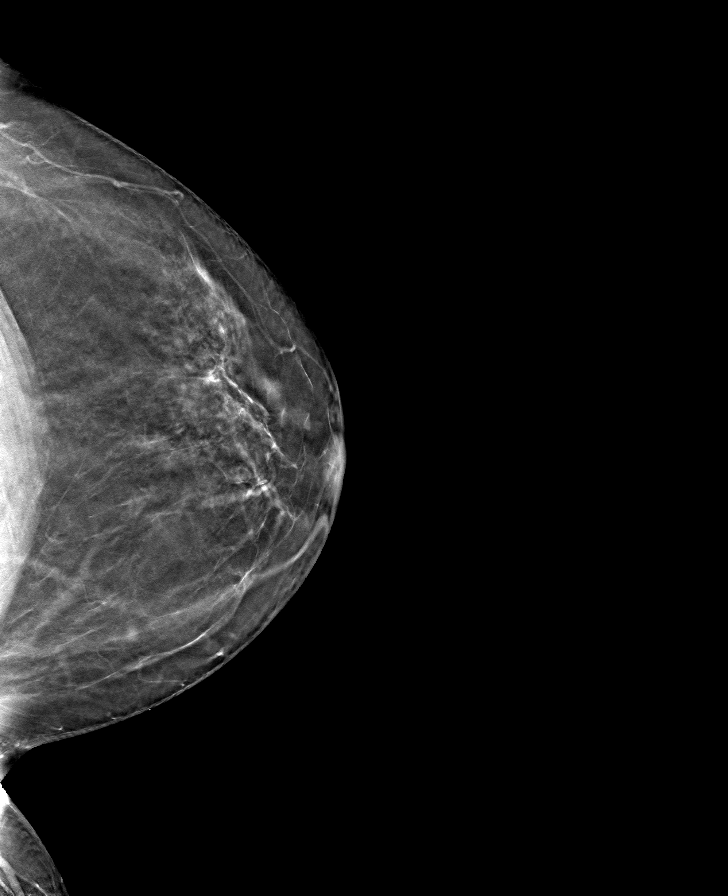

[L MLO tomo · tomo slice 41/80.0]
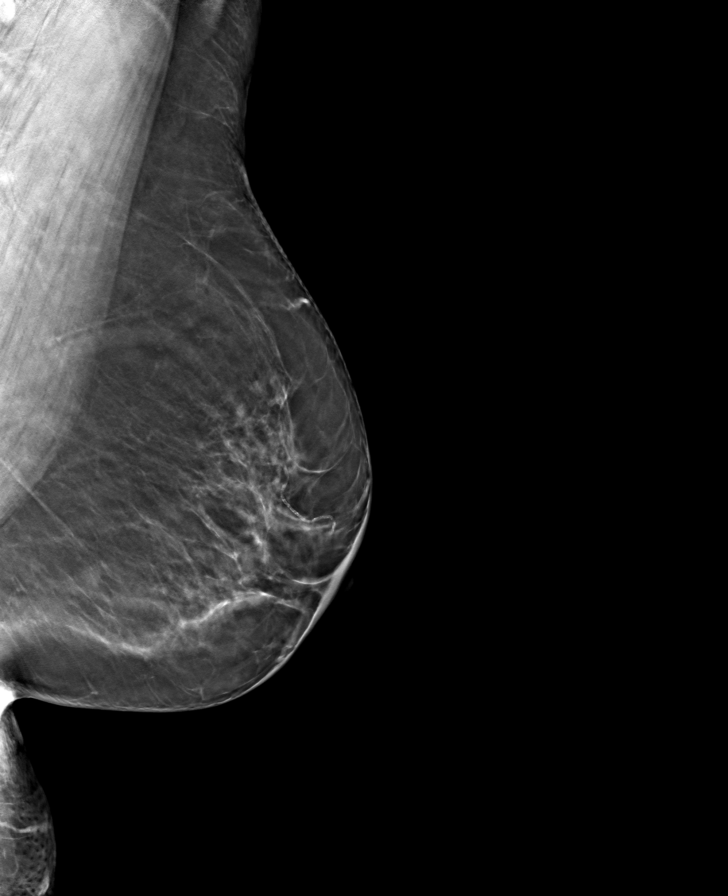

[R CC tomo · tomo slice 38/75.0]
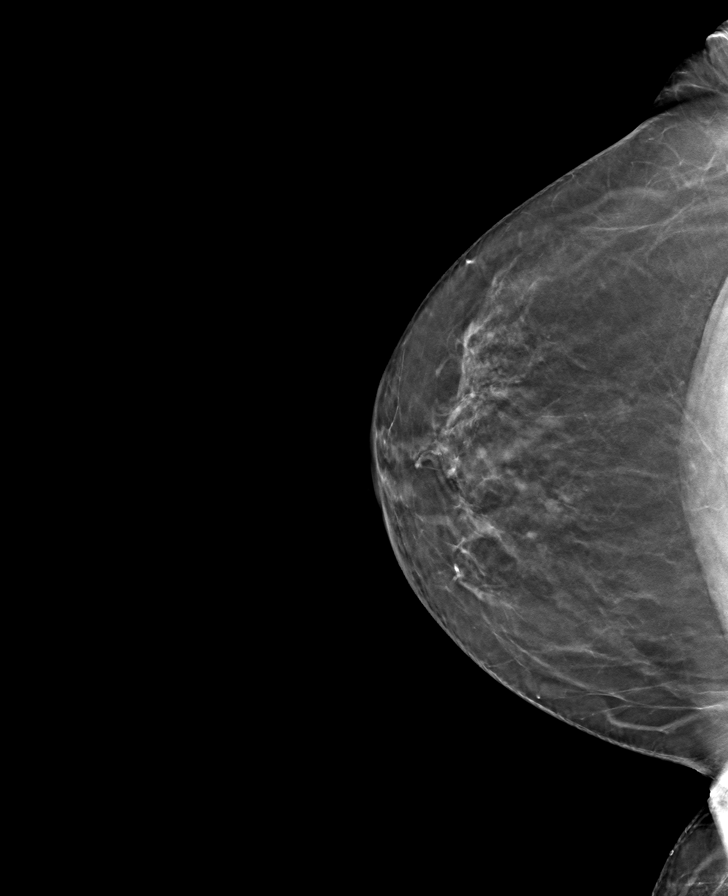

[8 of 24 positions shown; findings below may reference images not displayed]

ACR Breast Density Category b: There are scattered areas of
fibroglandular density.
FINDINGS: There are no findings suspicious for malignancy.
IMPRESSION: No mammographic evidence of malignancy. A result letter of this
screening mammogram will be mailed directly to the patient.

RECOMMENDATION:
Screening mammogram in one year. (Code:51-O-LD2)

BI-RADS CATEGORY  1: Negative.

## 2021-10-13 ENCOUNTER — Telehealth: Payer: Self-pay

## 2021-10-13 ENCOUNTER — Encounter: Payer: Self-pay | Admitting: Physician Assistant

## 2021-10-13 ENCOUNTER — Ambulatory Visit (INDEPENDENT_AMBULATORY_CARE_PROVIDER_SITE_OTHER): Payer: Medicare HMO | Admitting: Physician Assistant

## 2021-10-13 VITALS — BP 147/82 | HR 74 | Ht 63.0 in | Wt 174.1 lb

## 2021-10-13 DIAGNOSIS — E7800 Pure hypercholesterolemia, unspecified: Secondary | ICD-10-CM

## 2021-10-13 DIAGNOSIS — Z23 Encounter for immunization: Secondary | ICD-10-CM | POA: Diagnosis not present

## 2021-10-13 DIAGNOSIS — R030 Elevated blood-pressure reading, without diagnosis of hypertension: Secondary | ICD-10-CM | POA: Diagnosis not present

## 2021-10-13 NOTE — Telephone Encounter (Signed)
Pt aware her nurse visit will need to be an OV to see Ria Comment this morning. Pt verbalized understanding concerning needing to see pcp. Appt has been changed.

## 2021-10-13 NOTE — Assessment & Plan Note (Signed)
Advised we repeat fasting labs and f/u with a cpe in 1-2 months.  Can discuss bw results at that time Currently manages with crestor 5 mg and a fish oil supplement The 10-year ASCVD risk score (Arnett DK, et al., 2019) is: 15.7%

## 2021-10-13 NOTE — Progress Notes (Signed)
I,Sha'taria Tyson,acting as a Education administrator for [employer], PA-C.,have documented all relevant documentation on the behalf of [name], PA-C,as directed by  [name], PA-C while in the presence of [name], PA-C.   Established patient visit   Patient: Katie Bass   DOB: 03-26-1950   71 y.o. Female  MRN: [id number] Visit Date: 10/13/2021  Today's healthcare provider: [name], PA-C   Cc. Hld f/u  Subjective    HPI  Lipid/Cholesterol, Follow-up  Last lipid panel Other pertinent labs  Lab Results  Component Value Date   CHOL 248 (H) 07/10/2020   HDL 67 07/10/2020   LDLCALC 140 (H) 07/10/2020   TRIG 233 (H) 07/10/2020   CHOLHDL 3.7 07/10/2020   Lab Results  Component Value Date   ALT 15 07/10/2020   AST 18 07/10/2020   PLT 237 01/25/2018   TSH 0.951 01/25/2018      Management since that visit includes taking crestor 5 mg, fish oil supplement.   She reports good compliance with treatment. She is not having side effects.   Symptoms: No chest pain No chest pressure/discomfort  No dyspnea No lower extremity edema  No numbness or tingling of extremity No orthopnea  No palpitations No paroxysmal nocturnal dyspnea  No speech difficulty No syncope   Current diet: well balanced Current exercise: none  The 10-year ASCVD risk score (Arnett DK, et al., 2019) is: 15.7%  ---------------------------------------------------------------------------------------------------   Medications: Outpatient Medications Prior to Visit  Medication Sig   Ergocalciferol (VITAMIN D2) 2000 UNITS TABS Take 1 tablet by mouth daily.   Multiple Vitamin tablet Take 1 tablet by mouth daily.   Omega-3 Fatty Acids (FISH OIL) 1000 MG CAPS Take by mouth daily.   rosuvastatin (CRESTOR) 5 MG tablet Take 1 tablet by mouth once daily   [DISCONTINUED] amoxicillin (AMOXIL) 875 MG tablet  (Patient not taking: Reported on 07/16/2021)   [DISCONTINUED] amoxicillin-clavulanate  (AUGMENTIN) 875-125 MG tablet Take 1 tablet by mouth 2 (two) times daily. (Patient not taking: Reported on 07/16/2021)   [DISCONTINUED] chlorhexidine (PERIDEX) 0.12 % solution SMARTSIG:By Mouth (Patient not taking: Reported on 07/16/2021)   [DISCONTINUED] fluticasone (FLONASE) 50 MCG/ACT nasal spray Place 2 sprays into both nostrils daily.   [DISCONTINUED] oxyCODONE (OXY IR/ROXICODONE) 5 MG immediate release tablet  (Patient not taking: Reported on 07/16/2021)   No facility-administered medications prior to visit.    Review of Systems  Constitutional:  Negative for fatigue and fever.  Respiratory:  Negative for cough and shortness of breath.   Cardiovascular:  Negative for chest pain and leg swelling.  Gastrointestinal:  Negative for abdominal pain.  Neurological:  Negative for dizziness and headaches.       Objective    Blood pressure (!) 166/75, pulse 74, height '5\' 3"'$  (1.6 m), weight 174 lb 1.6 oz (79 kg), SpO2 100 %.   Physical Exam Constitutional:      General: She is awake.     Appearance: She is well-developed.  HENT:     Head: Normocephalic.  Eyes:     Conjunctiva/sclera: Conjunctivae normal.  Cardiovascular:     Rate and Rhythm: Normal rate and regular rhythm.     Heart sounds: Normal heart sounds.  Pulmonary:     Effort: Pulmonary effort is normal.     Breath sounds: Normal breath sounds.  Musculoskeletal:     Right lower leg: No edema.     Left lower leg: No edema.  Skin:    General: Skin is warm.  Neurological:     Mental Status: She is alert and oriented to person, place, and time.  Psychiatric:        Attention and Perception: Attention normal.        Mood and Affect: Mood normal.        Speech: Speech normal.        Behavior: Behavior is cooperative.     No results found for any visits on 10/13/21.  Assessment & Plan     Problem List Items Addressed This Visit       Other   Hypercholesterolemia - Primary    Advised we repeat fasting labs and f/u with  a cpe in 1-2 months.  Can discuss bw results at that time Currently manages with crestor 5 mg and a fish oil supplement The 10-year ASCVD risk score (Arnett DK, et al., 2019) is: 15.7%        Relevant Orders   Lipid Profile   Comprehensive Metabolic Panel (CMET)   Elevated blood pressure reading in office without diagnosis of hypertension    Pt reports values in the 130s/60s-70s at home Repeat  Will monitor, pt to continue checking at home F/u 1-2 mo for cpe      Relevant Orders   Comprehensive Metabolic Panel (CMET)   CBC w/Diff/Platelet   TSH   Other Visit Diagnoses     Need for Streptococcus pneumoniae vaccination       Relevant Orders   Pneumococcal conjugate vaccine 20-valent (Prevnar 20) (Completed)        Return in about 4 weeks (around 11/10/2021) for CPE.      I, [name], PA-C have reviewed all documentation for this visit. The documentation on  10/13/2021  for the exam, diagnosis, procedures, and orders are all accurate and complete.  [name], PA-C [facility] [address] [place], [place], [address] Office: [phone] Fax: [place]

## 2021-10-13 NOTE — Telephone Encounter (Signed)
Contacted patient to make her aware todays nurse visit would need to be a office visit or a physical appointment per provider and if she would have time to be seen today by the provider. LVMTCB. CRM created. Ok for [facility] to advise.  Adding [facility] Admin so that you all are aware when patient is checking in to verify if she is okay with being seen. Appointment can not be a nurse visit

## 2021-10-13 NOTE — Assessment & Plan Note (Signed)
Pt reports values in the 130s/60s-70s at home Repeat  Will monitor, pt to continue checking at home F/u 1-2 mo for cpe

## 2021-11-11 ENCOUNTER — Ambulatory Visit
Admission: RE | Admit: 2021-11-11 | Discharge: 2021-11-11 | Disposition: A | Discharge status: Home / Self Care | Payer: Medicare HMO | Source: Ambulatory Visit | Attending: Physician Assistant | Admitting: Physician Assistant

## 2021-11-11 DIAGNOSIS — R030 Elevated blood-pressure reading, without diagnosis of hypertension: Secondary | ICD-10-CM | POA: Diagnosis not present

## 2021-11-11 DIAGNOSIS — Z1231 Encounter for screening mammogram for malignant neoplasm of breast: Secondary | ICD-10-CM | POA: Insufficient documentation

## 2021-11-11 DIAGNOSIS — E7800 Pure hypercholesterolemia, unspecified: Secondary | ICD-10-CM | POA: Diagnosis not present

## 2021-11-12 LAB — COMPREHENSIVE METABOLIC PANEL
ALT: 11 IU/L (ref 0–32)
AST: 14 IU/L (ref 0–40)
Albumin/Globulin Ratio: 2.3 — ABNORMAL HIGH (ref 1.2–2.2)
Albumin: 4.4 g/dL (ref 3.9–4.9)
Alkaline Phosphatase: 106 IU/L (ref 44–121)
BUN/Creatinine Ratio: 12 (ref 12–28)
BUN: 9 mg/dL (ref 8–27)
Bilirubin Total: 0.4 mg/dL (ref 0.0–1.2)
CO2: 23 mmol/L (ref 20–29)
Calcium: 9.8 mg/dL (ref 8.7–10.3)
Chloride: 103 mmol/L (ref 96–106)
Creatinine, Ser: 0.73 mg/dL (ref 0.57–1.00)
Globulin, Total: 1.9 g/dL (ref 1.5–4.5)
Glucose: 101 mg/dL — ABNORMAL HIGH (ref 70–99)
Potassium: 4.8 mmol/L (ref 3.5–5.2)
Sodium: 140 mmol/L (ref 134–144)
Total Protein: 6.3 g/dL (ref 6.0–8.5)
eGFR: 88 mL/min/{1.73_m2} (ref >59)

## 2021-11-12 LAB — CBC WITH DIFFERENTIAL/PLATELET
Basophils Absolute: 0 10*3/uL (ref 0.0–0.2)
Basos: 1 %
EOS (ABSOLUTE): 0.1 10*3/uL (ref 0.0–0.4)
Eos: 3 %
Hematocrit: 40.3 % (ref 34.0–46.6)
Hemoglobin: 13.6 g/dL (ref 11.1–15.9)
Immature Grans (Abs): 0 10*3/uL (ref 0.0–0.1)
Immature Granulocytes: 0 %
Lymphocytes Absolute: 1.7 10*3/uL (ref 0.7–3.1)
Lymphs: 37 %
MCH: 30.1 pg (ref 26.6–33.0)
MCHC: 33.7 g/dL (ref 31.5–35.7)
MCV: 89 fL (ref 79–97)
Monocytes Absolute: 0.4 10*3/uL (ref 0.1–0.9)
Monocytes: 8 %
Neutrophils Absolute: 2.3 10*3/uL (ref 1.4–7.0)
Neutrophils: 51 %
Platelets: 261 10*3/uL (ref 150–450)
RBC: 4.52 x10E6/uL (ref 3.77–5.28)
RDW: 12.6 % (ref 11.7–15.4)
WBC: 4.5 10*3/uL (ref 3.4–10.8)

## 2021-11-12 LAB — LIPID PANEL
Chol/HDL Ratio: 2.6 ratio (ref 0.0–4.4)
Cholesterol, Total: 146 mg/dL (ref 100–199)
HDL: 57 mg/dL (ref >39)
LDL Chol Calc (NIH): 64 mg/dL (ref 0–99)
Triglycerides: 147 mg/dL (ref 0–149)
VLDL Cholesterol Cal: 25 mg/dL (ref 5–40)

## 2021-11-12 LAB — TSH: TSH: 0.806 u[IU]/mL (ref 0.450–4.500)

## 2021-12-05 ENCOUNTER — Other Ambulatory Visit: Payer: Self-pay | Admitting: Physician Assistant

## 2022-01-19 NOTE — Progress Notes (Deleted)
Complete physical exam   Patient: Katie Bass   DOB: [birth year]   72 y.o. Female  MRN: [id number] Visit Date: 01/20/2022  Today's healthcare provider: [name], PA-C   No chief complaint on file.  Subjective    Katie Bass is a 72 y.o. female who presents today for a complete physical exam.  She reports consuming a {diet types:17450} diet. {Exercise:19826} She generally feels {well/fairly well/poorly:18703}. She reports sleeping {well/fairly well/poorly:18703}. She {does/does not:200015} have additional problems to discuss today.  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  Tobacco Use   Smoking status: Former   Smokeless tobacco: Never   Tobacco comments:    smoked <1pack if cigarettesper day, for 1 year in her 72s  Vaping Use   Vaping Use: Never used  Substance and Sexual Activity   Alcohol use: No   Drug use: No   Sexual activity: Not on file  Other Topics Concern   Not on file  Social History Narrative   Not on file   Social Determinants of Health   Financial Resource Strain: Low Risk  (07/16/2021)   Overall Financial Resource Strain (CARDIA)    Difficulty of Paying Living Expenses: Not hard at all  Food Insecurity: No Food Insecurity (07/16/2021)   Hunger Vital Sign    Worried About Running Out of Food in the Last Year: Never true    [place] in the Last Year: Never true  Transportation Needs: No Transportation Needs (07/16/2021)   PRAPARE - Hydrologist  (Medical): No    Lack of Transportation (Non-Medical): No  Physical Activity: Unknown (07/16/2021)   Exercise Vital Sign    Days of Exercise per Week: Patient refused    Minutes of Exercise per Session: Patient refused  Stress: No Stress Concern Present (12/02/2017)   [place]    Feeling of Stress : Only a little  Social Connections: Socially Isolated (07/16/2021)   Social Connection and Isolation Panel [NHANES]    Frequency of Communication with Friends and Family: More than three times a week    Frequency of Social Gatherings with Friends and Family: Once a week    Attends Religious Services: Never    Marine scientist or Organizations: Not on file    Attends Archivist Meetings: Never    Marital Status: Widowed  Intimate Partner Violence: Not At Risk (07/16/2021)   Humiliation, Afraid, Rape, and Kick questionnaire    Fear of Current or Ex-Partner: No    Emotionally Abused: No    Physically Abused: No    Sexually Abused: No   Family Status  Relation Name Status   Mother  Deceased       cause of death was ovarian cancer and uterine cancer   Father  Deceased       cause of death was stroke  Brother  Alive   Daughter  Alive   Son  Alive   Sister Amy Alive   Family History  Problem Relation Age of Onset   Cancer Mother    Breast cancer Sister 45   Allergies  Allergen Reactions   Sulfa Antibiotics Itching and Swelling    Patient Care Team: [name], PA-C as PCP - General (Physician Assistant)   Medications: Outpatient Medications Prior to Visit  Medication Sig   Ergocalciferol (VITAMIN D2) 2000 UNITS TABS Take 1 tablet by mouth daily.   Multiple Vitamin tablet Take 1 tablet by mouth daily.   Omega-3 Fatty Acids (FISH OIL) 1000 MG CAPS Take by mouth daily.   rosuvastatin (CRESTOR) 5 MG tablet Take 1 tablet by mouth once daily   No facility-administered medications prior to visit.     Review of Systems  {Labs  Heme  Chem  Endocrine  Serology  Results Review (optional):23779}  Objective    There were no vitals taken for this visit. {Show previous vital signs (optional):23777}   Physical Exam  ***  Last depression screening scores    10/13/2021   11:00 AM 07/16/2021    8:15 AM 07/10/2020    3:54 PM  PHQ 2/9 Scores  PHQ - 2 Score 0 0 0  PHQ- 9 Score 0 0 0   Last fall risk screening    10/13/2021   10:59 AM  [place] in the past year? 0  Number falls in past yr: 0  Injury with Fall? 0  Risk for fall due to : No Fall Risks  Follow up Falls evaluation completed   Last Audit-C alcohol use screening    10/13/2021   10:59 AM  Alcohol Use Disorder Test (AUDIT)  1. How often do you have a drink containing alcohol? 0  2. How many drinks containing alcohol do you have on a typical day when you are drinking? 0  3. How often do you have six or more drinks on one occasion? 0  AUDIT-C Score 0   A score of 3 or more in women, and 4 or more in men indicates increased risk for alcohol abuse, EXCEPT if all of the points are from question 1   No results found for any visits on 01/20/22.  Assessment & Plan    Routine Health Maintenance and Physical Exam  Exercise Activities and Dietary recommendations  Goals      DIET - EAT MORE FRUITS AND VEGETABLES     Exercise 3x per week (30 min per time)     Recommend to exercise for 3 days a week for at least 30 minutes at a time.          Immunization History  Administered Date(s) Administered   Fluad Quad(high Dose 65+) 10/06/2018, 09/18/2019, 11/05/2020, 09/23/2021   Influenza Whole 10/20/2009   Influenza, High Dose Seasonal PF 10/18/2016, 10/19/2017   Influenza,inj,Quad PF,6+ Mos 10/19/2012, 10/04/2013, 10/10/2014, 10/22/2015   MODERNA COVID-19 SARS-COV-2 PEDS BIVALENT BOOSTER 6Y-11Y 11/13/2020   Moderna Sars-Covid-2 Vaccination 02/25/2019, 03/26/2019   PNEUMOCOCCAL CONJUGATE-20 10/13/2021    Pneumococcal Conjugate-13 12/02/2017    Health Maintenance  Topic Date Due   DTaP/Tdap/Td (1 - Tdap) Never done   Zoster Vaccines- Shingrix (1 of 2) Never done   COVID-19 Vaccine (4 - 2023-24 season) 09/11/2021   Medicare Annual Wellness (AWV)  07/17/2022   MAMMOGRAM  11/12/2022   COLONOSCOPY (Pts 45-63yr Insurance coverage will need to be confirmed)  06/08/2023  Pneumonia Vaccine 32+ Years old  Completed   INFLUENZA VACCINE  Completed   DEXA SCAN  Completed   Hepatitis C Screening  Completed   HPV VACCINES  Aged Out    Discussed health benefits of physical activity, and encouraged her to engage in regular exercise appropriate for her age and condition.  ***  No follow-ups on file.     {provider attestation***:1}   [name], PA-C  [facility] [phone] (phone) [phone] (fax)  [place]

## 2022-01-20 ENCOUNTER — Encounter: Payer: Medicare HMO | Admitting: Physician Assistant

## 2022-01-21 ENCOUNTER — Ambulatory Visit (INDEPENDENT_AMBULATORY_CARE_PROVIDER_SITE_OTHER): Payer: Medicare HMO | Admitting: Physician Assistant

## 2022-01-21 ENCOUNTER — Encounter: Payer: Self-pay | Admitting: Physician Assistant

## 2022-01-21 VITALS — BP 138/86 | HR 82 | Temp 98.7°F | Resp 16 | Ht 63.0 in | Wt 170.6 lb

## 2022-01-21 DIAGNOSIS — J45909 Unspecified asthma, uncomplicated: Secondary | ICD-10-CM | POA: Diagnosis not present

## 2022-01-21 DIAGNOSIS — J0110 Acute frontal sinusitis, unspecified: Secondary | ICD-10-CM

## 2022-01-21 DIAGNOSIS — E7800 Pure hypercholesterolemia, unspecified: Secondary | ICD-10-CM

## 2022-01-21 DIAGNOSIS — R030 Elevated blood-pressure reading, without diagnosis of hypertension: Secondary | ICD-10-CM

## 2022-01-21 DIAGNOSIS — J3089 Other allergic rhinitis: Secondary | ICD-10-CM

## 2022-01-21 DIAGNOSIS — Z0000 Encounter for general adult medical examination without abnormal findings: Secondary | ICD-10-CM | POA: Diagnosis not present

## 2022-01-21 MED ORDER — FLUTICASONE PROPIONATE 50 MCG/ACT NA SUSP: 2.0000 | Freq: Every day | NASAL | 6 refills | Status: DC

## 2022-01-21 MED ORDER — ALBUTEROL SULFATE HFA 108 (90 BASE) MCG/ACT IN AERS: 2.0000 | INHALATION_SPRAY | Freq: Four times a day (QID) | RESPIRATORY_TRACT | 2 refills | Status: DC | PRN

## 2022-01-21 MED ORDER — FEXOFENADINE HCL 180 MG PO TABS: 180.0000 mg | ORAL_TABLET | Freq: Every day | ORAL | 1 refills | Status: AC

## 2022-01-21 MED ORDER — AMOXICILLIN 875 MG PO TABS: 875.0000 mg | ORAL_TABLET | Freq: Two times a day (BID) | ORAL | 0 refills | Status: AC

## 2022-01-21 NOTE — Assessment & Plan Note (Signed)
Rx flonase Advised switching from generic equate allergy pill to allegra daily, rx sent

## 2022-01-21 NOTE — Assessment & Plan Note (Signed)
Borderline today but in more appropriate range Will continue to monitor

## 2022-01-21 NOTE — Progress Notes (Signed)
I,[name],acting as a Education administrator for [employer], PA-C.,have documented all relevant documentation on the behalf of Katie Kirschner, PA-C,as directed by  Katie Kirschner, PA-C while in the presence of Katie Kirschner, PA-C.   Complete physical exam   Patient: Katie Bass   DOB: [birth year]   72 y.o. Female  MRN: [id number] Visit Date: 01/21/2022  Today's healthcare provider: [name], PA-C   Chief Complaint  Patient presents with   Annual Exam   Subjective    Katie Bass is a 72 y.o. female who presents today for a complete physical exam.  She reports consuming a general diet.  Line dance and Shag 1-2/wk  She generally feels fairly well. She reports sleeping well. She does have additional problems to discuss today.  HPI  Pt reports nasal congestion, PND, vertigo symptoms and ear squeaking x 2 months. Reports intermittent sinus pressure and pain with excessive yellow/green mucous. Denies fevers. Reports sometimes she coughs to the point she cannot catch her breath. She has an old albuterol inhaler that she uses at these times which helps.   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  Tobacco Use   Smoking status: Former   Smokeless tobacco: Never   Tobacco comments:    smoked <1pack if cigarettesper day, for 1 year in her 50s  Vaping Use   Vaping Use: Never used  Substance and Sexual Activity   Alcohol use: No   Drug use: No   Sexual activity: Not on file  Other Topics Concern   Not on file  Social History  Narrative   Not on file   Social Determinants of Health   Financial Resource Strain: Low Risk  (07/16/2021)   Overall Financial Resource Strain (CARDIA)    Difficulty of Paying Living Expenses: Not hard at all  Food Insecurity: No Food Insecurity (07/16/2021)   Hunger Vital Sign    Worried About Running Out of Food in the Last Year: Never true    [place] in the Last Year: Never true  Transportation Needs: No Transportation Needs (07/16/2021)   PRAPARE - Hydrologist (Medical): No    Lack of Transportation (Non-Medical): No  Physical Activity: Unknown (07/16/2021)   Exercise Vital Sign    Days of Exercise per Week: Patient refused    Minutes of Exercise per Session: Patient refused  Stress: No Stress Concern Present (12/02/2017)   [place]    Feeling of Stress : Only a little  Social Connections: Socially Isolated (07/16/2021)   Social Connection and Isolation Panel [NHANES]    Frequency of Communication with Friends and Family: More than three times a week    Frequency of Social Gatherings with Friends and Family: Once a week    Attends Religious Services: Never    Marine scientist or Organizations: Not on file    Attends Archivist Meetings:  Never    Marital Status: Widowed  Intimate Partner Violence: Not At Risk (07/16/2021)   Humiliation, Afraid, Rape, and Kick questionnaire    Fear of Current or Ex-Partner: No    Emotionally Abused: No    Physically Abused: No    Sexually Abused: No   Family Status  Relation Name Status   Mother  Deceased       cause of death was ovarian cancer and uterine cancer   Father  Deceased       cause of death was stroke   Brother  Alive   Daughter  Alive   Son  Alive   Sister Amy Alive   Family History  Problem Relation Age of Onset   Cancer Mother    Breast cancer Sister 57   Allergies  Allergen Reactions   Sulfa  Antibiotics Itching and Swelling    Patient Care Team: Katie Kirschner, PA-C as PCP - General (Physician Assistant)   Medications: Outpatient Medications Prior to Visit  Medication Sig   Ergocalciferol (VITAMIN D2) 2000 UNITS TABS Take 1 tablet by mouth daily.   Multiple Vitamin tablet Take 1 tablet by mouth daily.   Omega-3 Fatty Acids (FISH OIL) 1000 MG CAPS Take by mouth daily.   rosuvastatin (CRESTOR) 5 MG tablet Take 1 tablet by mouth once daily   No facility-administered medications prior to visit.    Review of Systems  HENT:  Positive for sinus pressure.   All other systems reviewed and are negative.  Last CBC Lab Results  Component Value Date   WBC 4.5 11/11/2021   HGB 13.6 11/11/2021   HCT 40.3 11/11/2021   MCV 89 11/11/2021   MCH 30.1 11/11/2021   RDW 12.6 11/11/2021   PLT 261 35/46/5681   Last metabolic panel Lab Results  Component Value Date   GLUCOSE 101 (H) 11/11/2021   NA 140 11/11/2021   K 4.8 11/11/2021   CL 103 11/11/2021   CO2 23 11/11/2021   BUN 9 11/11/2021   CREATININE 0.73 11/11/2021   EGFR 88 11/11/2021   CALCIUM 9.8 11/11/2021   PROT 6.3 11/11/2021   ALBUMIN 4.4 11/11/2021   LABGLOB 1.9 11/11/2021   AGRATIO 2.3 (H) 11/11/2021   BILITOT 0.4 11/11/2021   ALKPHOS 106 11/11/2021   AST 14 11/11/2021   ALT 11 11/11/2021   Last lipids Lab Results  Component Value Date   CHOL 146 11/11/2021   HDL 57 11/11/2021   LDLCALC 64 11/11/2021   TRIG 147 11/11/2021   CHOLHDL 2.6 11/11/2021   Last hemoglobin A1c Lab Results  Component Value Date   HGBA1C 5.5 01/25/2018   Last thyroid functions Lab Results  Component Value Date   TSH 0.806 11/11/2021   Last vitamin D Lab Results  Component Value Date   VD25OH 22.1 (L) 01/25/2018      Objective    BP 138/86 (BP Location: Right Arm, Patient Position: Sitting, Cuff Size: Large)   Pulse 82   Temp 98.7 F (37.1 C) (Temporal)   Resp 16   Ht '5\' 3"'$  (1.6 m)   Wt 170 lb 9.6 oz (77.4 kg)    SpO2 99%   BMI 30.22 kg/m  BP Readings from Last 3 Encounters:  01/21/22 138/86  10/13/21 (!) 147/82  03/10/21 (!) 155/91   Wt Readings from Last 3 Encounters:  01/21/22 170 lb 9.6 oz (77.4 kg)  10/13/21 174 lb 1.6 oz (79 kg)  07/16/21 165 lb (74.8 kg)  Physical Exam Constitutional:      General: She is awake.     Appearance: She is well-developed. She is not ill-appearing.  HENT:     Head: Normocephalic.     Right Ear: Tympanic membrane normal.     Left Ear: Tympanic membrane normal.     Nose: Congestion present. No rhinorrhea.     Mouth/Throat:     Pharynx: No oropharyngeal exudate or posterior oropharyngeal erythema.  Eyes:     Conjunctiva/sclera: Conjunctivae normal.     Pupils: Pupils are equal, round, and reactive to light.  Neck:     Thyroid: No thyroid mass or thyromegaly.  Cardiovascular:     Rate and Rhythm: Normal rate and regular rhythm.     Heart sounds: Normal heart sounds.  Pulmonary:     Effort: Pulmonary effort is normal.     Breath sounds: Normal breath sounds. No wheezing or rales.  Abdominal:     Palpations: Abdomen is soft.     Tenderness: There is no abdominal tenderness.  Musculoskeletal:     Right lower leg: No swelling. No edema.     Left lower leg: No swelling. No edema.  Lymphadenopathy:     Cervical: No cervical adenopathy.  Skin:    General: Skin is warm.  Neurological:     Mental Status: She is alert and oriented to person, place, and time.  Psychiatric:        Attention and Perception: Attention normal.        Mood and Affect: Mood normal.        Speech: Speech normal.        Behavior: Behavior normal. Behavior is cooperative.     Last depression screening scores    01/21/2022    2:52 PM 10/13/2021   11:00 AM 07/16/2021    8:15 AM  PHQ 2/9 Scores  PHQ - 2 Score 0 0 0  PHQ- 9 Score 0 0 0   Last fall risk screening    01/21/2022    2:52 PM  Fall Risk   Falls in the past year? 0  Number falls in past yr: 0  Injury  with Fall? 0  Risk for fall due to : No Fall Risks  Follow up Falls evaluation completed   Last Audit-C alcohol use screening    01/21/2022    2:52 PM  Alcohol Use Disorder Test (AUDIT)  1. How often do you have a drink containing alcohol? 0  2. How many drinks containing alcohol do you have on a typical day when you are drinking? 0  3. How often do you have six or more drinks on one occasion? 0  AUDIT-C Score 0   A score of 3 or more in women, and 4 or more in men indicates increased risk for alcohol abuse, EXCEPT if all of the points are from question 1   No results found for any visits on 01/21/22.  Assessment & Plan    Routine Health Maintenance and Physical Exam  Exercise Activities and Dietary recommendations --balanced diet high in fiber and protein, low in sugars, carbs, fats. --physical activity/exercise 30 minutes 3-5 times a week     Immunization History  Administered Date(s) Administered   Fluad Quad(high Dose 65+) 10/06/2018, 09/18/2019, 11/05/2020, 09/23/2021   Influenza Whole 10/20/2009   Influenza, High Dose Seasonal PF 10/18/2016, 10/19/2017   Influenza,inj,Quad PF,6+ Mos 10/19/2012, 10/04/2013, 10/10/2014, 10/22/2015   MODERNA COVID-19 SARS-COV-2 PEDS BIVALENT BOOSTER 6Y-11Y 11/13/2020   Moderna Sars-Covid-2 Vaccination  02/25/2019, 03/26/2019   PNEUMOCOCCAL CONJUGATE-20 10/13/2021   Pneumococcal Conjugate-13 12/02/2017    Health Maintenance  Topic Date Due   DTaP/Tdap/Td (1 - Tdap) Never done   Zoster Vaccines- Shingrix (1 of 2) Never done   COVID-19 Vaccine (4 - 2023-24 season) 09/11/2021   Medicare Annual Wellness (AWV)  07/17/2022   MAMMOGRAM  11/12/2022   COLONOSCOPY (Pts 45-42yr Insurance coverage will need to be confirmed)  06/08/2023   Pneumonia Vaccine 72 Years old  Completed   INFLUENZA VACCINE  Completed   DEXA SCAN  Completed   Hepatitis C Screening  Completed   HPV VACCINES  Aged Out    Discussed health benefits of physical activity,  and encouraged her to engage in regular exercise appropriate for her age and condition.  Acute sinusitis -increase fluids -rx amoxicillin 875 mg bid x 7 days  -switched to allegra, continue flonase.  Problem List Items Addressed This Visit       Respiratory   Allergic rhinitis    Rx flonase Advised switching from generic equate allergy pill to allegra daily, rx sent      Relevant Medications   fluticasone (FLONASE) 50 MCG/ACT nasal spray   fexofenadine (ALLEGRA) 180 MG tablet   Reactive airway disease without complication    Refilled albuterol inhaler prn but consider pulm/allergist referral if symptoms persist      Relevant Medications   albuterol (VENTOLIN HFA) 108 (90 Base) MCG/ACT inhaler     Other   Hypercholesterolemia    Manages with crestor 5 mg and fish oil, cholesterol is much improved  LDL at goal           Elevated blood pressure reading in office without diagnosis of hypertension    Borderline today but in more appropriate range Will continue to monitor      Other Visit Diagnoses     Encounter for annual physical exam    -  Primary   Acute non-recurrent frontal sinusitis       Relevant Medications   fluticasone (FLONASE) 50 MCG/ACT nasal spray   amoxicillin (AMOXIL) 875 MG tablet   fexofenadine (ALLEGRA) 180 MG tablet       Return in about 1 year (around 01/22/2023) for CPE, AVW.    I, LMikey Kirschner PA-C have reviewed all documentation for this visit. The documentation on  01/21/22  for the exam, diagnosis, procedures, and orders are all accurate and complete.  LMikey Kirschner PA-C [facility] [place] [address]Office: [phone]: [phone]

## 2022-01-21 NOTE — Assessment & Plan Note (Signed)
Refilled albuterol inhaler prn but consider pulm/allergist referral if symptoms persist

## 2022-01-21 NOTE — Assessment & Plan Note (Signed)
Manages with crestor 5 mg and fish oil, cholesterol is much improved  LDL at goal

## 2022-02-01 DIAGNOSIS — H43813 Vitreous degeneration, bilateral: Secondary | ICD-10-CM | POA: Diagnosis not present

## 2022-02-01 DIAGNOSIS — H04123 Dry eye syndrome of bilateral lacrimal glands: Secondary | ICD-10-CM | POA: Diagnosis not present

## 2022-02-01 DIAGNOSIS — H353131 Nonexudative age-related macular degeneration, bilateral, early dry stage: Secondary | ICD-10-CM | POA: Diagnosis not present

## 2022-02-01 DIAGNOSIS — H2513 Age-related nuclear cataract, bilateral: Secondary | ICD-10-CM | POA: Diagnosis not present

## 2022-02-03 ENCOUNTER — Encounter: Payer: Medicare HMO | Admitting: Physician Assistant

## 2022-03-29 ENCOUNTER — Encounter: Payer: Self-pay | Admitting: Physician Assistant

## 2022-03-31 ENCOUNTER — Ambulatory Visit (INDEPENDENT_AMBULATORY_CARE_PROVIDER_SITE_OTHER): Payer: Medicare HMO | Admitting: Family Medicine

## 2022-03-31 VITALS — BP 138/75 | HR 82 | Temp 98.5°F | Ht 62.0 in | Wt 173.8 lb

## 2022-03-31 DIAGNOSIS — N3001 Acute cystitis with hematuria: Secondary | ICD-10-CM | POA: Diagnosis not present

## 2022-03-31 DIAGNOSIS — R030 Elevated blood-pressure reading, without diagnosis of hypertension: Secondary | ICD-10-CM

## 2022-03-31 DIAGNOSIS — N390 Urinary tract infection, site not specified: Secondary | ICD-10-CM | POA: Insufficient documentation

## 2022-03-31 DIAGNOSIS — L2082 Flexural eczema: Secondary | ICD-10-CM

## 2022-03-31 LAB — POCT URINALYSIS DIPSTICK
Bilirubin, UA: NEGATIVE
Glucose, UA: NEGATIVE
Ketones, UA: NEGATIVE
Nitrite, UA: NEGATIVE
Protein, UA: POSITIVE — AB
Spec Grav, UA: 1.01 (ref 1.010–1.025)
Urobilinogen, UA: 0.2 E.U./dL
pH, UA: 6.5 (ref 5.0–8.0)

## 2022-03-31 MED ORDER — TRIAMCINOLONE ACETONIDE 0.5 % EX OINT: 1.0000 | TOPICAL_OINTMENT | Freq: Two times a day (BID) | CUTANEOUS | 0 refills | Status: DC

## 2022-03-31 MED ORDER — CIPROFLOXACIN HCL 250 MG PO TABS: 250.0000 mg | ORAL_TABLET | Freq: Two times a day (BID) | ORAL | 0 refills | Status: AC

## 2022-03-31 NOTE — Progress Notes (Signed)
I,J'ya E Hunter,acting as a scribe for Gwyneth Sprout, FNP.,have documented all relevant documentation on the behalf of Gwyneth Sprout, FNP,as directed by  Gwyneth Sprout, FNP while in the presence of Gwyneth Sprout, FNP.  Established patient visit  Patient: Katie Bass   DOB: 04/27/1950   72 y.o. Female  MRN: [id number] Visit Date: 03/31/2022  Today's healthcare provider: Gwyneth Sprout, FNP  Introduced to nurse practitioner role and practice setting.  All questions answered.  Discussed provider/patient relationship and expectations.  Subjective    HPI   Urinary Tract Infection: Patient complains of burning with urination and diurnal enuresis She has had symptoms for 5 days. Patient also complains of  urgency to urinate . Patient denies back pain, fever, headache, stomach ache, and vaginal discharge. Patient does not have a history of recurrent UTI.  Patient does not have a history of pyelonephritis.   Patient says she has been using AZO since Friday, pt reports that the OTC medication has helped with her urgency to urinate, describes the void as being smaller than normal.  Patient wants a refill on topical rx, TMC 0.1% cream BID for up to 2 weeks for right pretibial eczema.   Medications: Outpatient Medications Prior to Visit  Medication Sig   albuterol (VENTOLIN HFA) 108 (90 Base) MCG/ACT inhaler Inhale 2 puffs into the lungs every 6 (six) hours as needed for wheezing or shortness of breath.   Ergocalciferol (VITAMIN D2) 2000 UNITS TABS Take 1 tablet by mouth daily.   fexofenadine (ALLEGRA) 180 MG tablet Take 1 tablet (180 mg total) by mouth daily.   fluticasone (FLONASE) 50 MCG/ACT nasal spray Place 2 sprays into both nostrils daily.   Multiple Vitamin tablet Take 1 tablet by mouth daily.   Omega-3 Fatty Acids (FISH OIL) 1000 MG CAPS Take by mouth daily.   rosuvastatin (CRESTOR) 5 MG tablet Take 1 tablet by mouth once daily   No facility-administered medications prior to visit.    Review of Systems    Objective    BP 138/75 Comment: home  Pulse 82   Temp 98.5 F (36.9 C) (Oral)   Ht 5\' 2"  (1.575 m)   Wt 173 lb 12.8 oz (78.8 kg)   BMI 31.79 kg/m   Physical Exam Vitals and nursing note reviewed.  Constitutional:      General: She is not in acute distress.    Appearance: Normal appearance. She is obese. She is not ill-appearing, toxic-appearing or diaphoretic.  HENT:     Head: Normocephalic and atraumatic.  Cardiovascular:     Rate and Rhythm: Normal rate and regular rhythm.     Pulses: Normal pulses.     Heart sounds: Normal heart sounds. No murmur heard.    No friction rub. No gallop.  Pulmonary:     Effort: Pulmonary effort is normal. No respiratory distress.     Breath sounds: Normal breath sounds. No stridor. No wheezing, rhonchi or rales.  Chest:     Chest wall: No tenderness.  Abdominal:     General: Bowel sounds are normal.     Palpations: Abdomen is soft.     Tenderness: There is no abdominal tenderness. There is no right CVA tenderness or left CVA tenderness.  Musculoskeletal:        General: No swelling, tenderness, deformity or signs of injury. Normal range of motion.     Right lower leg: No edema.     Left lower leg: No edema.  Skin:  General: Skin is warm and dry.     Capillary Refill: Capillary refill takes less than 2 seconds.     Coloration: Skin is not jaundiced or pale.     Findings: No bruising, erythema, lesion or rash.  Neurological:     General: No focal deficit present.     Mental Status: She is alert and oriented to person, place, and time. Mental status is at baseline.     Cranial Nerves: No cranial nerve deficit.     Sensory: No sensory deficit.     Motor: No weakness.     Coordination: Coordination normal.  Psychiatric:        Mood and Affect: Mood normal.        Behavior: Behavior normal.        Thought Content: Thought content normal.        Judgment: Judgment normal.      Results for orders placed or  performed in visit on 03/31/22  POCT Urinalysis Dipstick  Result Value Ref Range   Color, UA Yellow    Clarity, UA Cloudy    Glucose, UA Negative Negative   Bilirubin, UA Negative    Ketones, UA Negative    Spec Grav, UA 1.010 1.010 - 1.025   Blood, UA Large    pH, UA 6.5 5.0 - 8.0   Protein, UA Positive (A) Negative   Urobilinogen, UA 0.2 0.2 or 1.0 E.U./dL   Nitrite, UA negative    Leukocytes, UA Moderate (2+) (A) Negative   Appearance     Odor      Assessment & Plan     Problem List Items Addressed This Visit       Musculoskeletal and Integument   Flexural eczema    Chronic, stable Previous rx from derm Refill sent today       Relevant Medications   triamcinolone ointment (KENALOG) 0.5 %     Genitourinary   Urinary tract infection without hematuria - Primary    No gross hematuria Abx started Ucx ordered Urinalysis noted blood       Relevant Medications   ciprofloxacin (CIPRO) 250 MG tablet     Other   Elevated blood pressure reading in office without diagnosis of hypertension    Reports home Bps stable; possible whitecoat htn Continue to monitor      Return if symptoms worsen or fail to improve.     Vonna Kotyk, FNP, have reviewed all documentation for this visit. The documentation on 03/31/22 for the exam, diagnosis, procedures, and orders are all accurate and complete.  Gwyneth Sprout, [place] [phone] (phone) [phone] (fax)  [place]

## 2022-03-31 NOTE — Assessment & Plan Note (Signed)
Reports home Bps stable; possible whitecoat htn Continue to monitor

## 2022-03-31 NOTE — Patient Instructions (Signed)
The 10-year ASCVD risk score (Arnett DK, et al., 2019) is: 9.9%   Values used to calculate the score:     Age: 72 years     Sex: Female     Is Non-Hispanic African American: No     Diabetic: No     Tobacco smoker: No     Systolic Blood Pressure: 0000000 mmHg     Is BP treated: No     HDL Cholesterol: 57 mg/dL     Total Cholesterol: 146 mg/dL

## 2022-03-31 NOTE — Assessment & Plan Note (Signed)
No gross hematuria Abx started Ucx ordered Urinalysis noted blood

## 2022-03-31 NOTE — Assessment & Plan Note (Signed)
Chronic, stable Previous rx from derm Refill sent today

## 2022-04-03 LAB — URINE CULTURE

## 2022-04-04 ENCOUNTER — Other Ambulatory Visit: Payer: Self-pay | Admitting: Family Medicine

## 2022-04-04 MED ORDER — AMOXICILLIN-POT CLAVULANATE 875-125 MG PO TABS: 1.0000 | ORAL_TABLET | Freq: Two times a day (BID) | ORAL | 0 refills | Status: DC

## 2022-04-04 NOTE — Progress Notes (Signed)
[facility]X called in for UTI.

## 2022-04-09 ENCOUNTER — Other Ambulatory Visit: Payer: Self-pay | Admitting: Physician Assistant

## 2022-04-09 DIAGNOSIS — J45909 Unspecified asthma, uncomplicated: Secondary | ICD-10-CM

## 2022-05-02 ENCOUNTER — Encounter: Payer: Self-pay | Admitting: Physician Assistant

## 2022-05-04 ENCOUNTER — Other Ambulatory Visit: Payer: Self-pay | Admitting: Physician Assistant

## 2022-05-04 DIAGNOSIS — L259 Unspecified contact dermatitis, unspecified cause: Secondary | ICD-10-CM

## 2022-05-04 MED ORDER — TRIAMCINOLONE ACETONIDE 0.1 % EX CREA: 1.0000 | TOPICAL_CREAM | Freq: Three times a day (TID) | CUTANEOUS | 0 refills | Status: DC

## 2022-05-30 ENCOUNTER — Other Ambulatory Visit: Payer: Self-pay | Admitting: Physician Assistant

## 2022-05-30 DIAGNOSIS — J45909 Unspecified asthma, uncomplicated: Secondary | ICD-10-CM

## 2022-08-24 ENCOUNTER — Ambulatory Visit (INDEPENDENT_AMBULATORY_CARE_PROVIDER_SITE_OTHER): Payer: Medicare HMO

## 2022-08-24 VITALS — Ht 63.0 in | Wt 163.0 lb

## 2022-08-24 DIAGNOSIS — Z0000 Encounter for general adult medical examination without abnormal findings: Secondary | ICD-10-CM

## 2022-08-24 NOTE — Patient Instructions (Signed)
[name] , Thank you for taking time to come for your Medicare Wellness Visit. I appreciate your ongoing commitment to your health goals. Please review the following plan we discussed and let me know if I can assist you in the future.   Referrals/Orders/Follow-Ups/Clinician Recommendations: none  This is a list of the screening recommended for you and due dates:  Health Maintenance  Topic Date Due   DTaP/Tdap/Td vaccine (1 - Tdap) Never done   Zoster (Shingles) Vaccine (1 of 2) Never done   COVID-19 Vaccine (4 - 2023-24 season) 09/11/2021   Flu Shot  08/12/2022   Mammogram  11/12/2022   Colon Cancer Screening  06/08/2023   Medicare Annual Wellness Visit  08/24/2023   Pneumonia Vaccine  Completed   DEXA scan (bone density measurement)  Completed   Hepatitis C Screening  Completed   HPV Vaccine  Aged Out    Advanced directives: (Copy Requested) Please bring a copy of your health care power of attorney and living will to the office to be added to your chart at your convenience.  Next Medicare Annual Wellness Visit scheduled for next year: Yes 08/29/2023 1pm telephone  Preventive Care 65 Years and Older, Female Preventive care refers to lifestyle choices and visits with your health care provider that can promote health and wellness. What does preventive care include? A yearly physical exam. This is also called an annual well check. Dental exams once or twice a year. Routine eye exams. Ask your health care provider how often you should have your eyes checked. Personal lifestyle choices, including: Daily care of your teeth and gums. Regular physical activity. Eating a healthy diet. Avoiding tobacco and drug use. Limiting alcohol use. Practicing safe sex. Taking low-dose aspirin every day. Taking vitamin and mineral supplements as recommended by your health care provider. What happens during an annual well check? The services and screenings done by your health care provider during  your annual well check will depend on your age, overall health, lifestyle risk factors, and family history of disease. Counseling  Your health care provider may ask you questions about your: Alcohol use. Tobacco use. Drug use. Emotional well-being. Home and relationship well-being. Sexual activity. Eating habits. History of falls. Memory and ability to understand (cognition). Work and work Astronomer. Reproductive health. Screening  You may have the following tests or measurements: Height, weight, and BMI. Blood pressure. Lipid and cholesterol levels. These may be checked every 5 years, or more frequently if you are over 8 years old. Skin check. Lung cancer screening. You may have this screening every year starting at age 73 if you have a 30-pack-year history of smoking and currently smoke or have quit within the past 15 years. Fecal occult blood test (FOBT) of the stool. You may have this test every year starting at age 2. Flexible sigmoidoscopy or colonoscopy. You may have a sigmoidoscopy every 5 years or a colonoscopy every 10 years starting at age 75. Hepatitis C blood test. Hepatitis B blood test. Sexually transmitted disease (STD) testing. Diabetes screening. This is done by checking your blood sugar (glucose) after you have not eaten for a while (fasting). You may have this done every 1-3 years. Bone density scan. This is done to screen for osteoporosis. You may have this done starting at age 8. Mammogram. This may be done every 1-2 years. Talk to your health care provider about how often you should have regular mammograms. Talk with your health care provider about your test results, treatment options,  and if necessary, the need for more tests. Vaccines  Your health care provider may recommend certain vaccines, such as: Influenza vaccine. This is recommended every year. Tetanus, diphtheria, and acellular pertussis (Tdap, Td) vaccine. You may need a Td booster every 10  years. Zoster vaccine. You may need this after age 106. Pneumococcal 13-valent conjugate (PCV13) vaccine. One dose is recommended after age 41. Pneumococcal polysaccharide (PPSV23) vaccine. One dose is recommended after age 49. Talk to your health care provider about which screenings and vaccines you need and how often you need them. This information is not intended to replace advice given to you by your health care provider. Make sure you discuss any questions you have with your health care provider. Document Released: 01/24/2015 Document Revised: 09/17/2015 Document Reviewed: 10/29/2014 Elsevier Interactive Patient Education  2017 [employer].  Fall Prevention in the Home Falls can cause injuries. They can happen to people of all ages. There are many things you can do to make your home safe and to help prevent falls. What can I do on the outside of my home? Regularly fix the edges of walkways and driveways and fix any cracks. Remove anything that might make you trip as you walk through a door, such as a raised step or threshold. Trim any bushes or trees on the path to your home. Use bright outdoor lighting. Clear any walking paths of anything that might make someone trip, such as rocks or tools. Regularly check to see if handrails are loose or broken. Make sure that both sides of any steps have handrails. Any raised decks and porches should have guardrails on the edges. Have any leaves, snow, or ice cleared regularly. Use sand or salt on walking paths during winter. Clean up any spills in your garage right away. This includes oil or grease spills. What can I do in the bathroom? Use night lights. Install grab bars by the toilet and in the tub and shower. Do not use towel bars as grab bars. Use non-skid mats or decals in the tub or shower. If you need to sit down in the shower, use a plastic, non-slip stool. Keep the floor dry. Clean up any water that spills on the floor as soon as it  happens. Remove soap buildup in the tub or shower regularly. Attach bath mats securely with double-sided non-slip rug tape. Do not have throw rugs and other things on the floor that can make you trip. What can I do in the bedroom? Use night lights. Make sure that you have a light by your bed that is easy to reach. Do not use any sheets or blankets that are too big for your bed. They should not hang down onto the floor. Have a firm chair that has side arms. You can use this for support while you get dressed. Do not have throw rugs and other things on the floor that can make you trip. What can I do in the kitchen? Clean up any spills right away. Avoid walking on wet floors. Keep items that you use a lot in easy-to-reach places. If you need to reach something above you, use a strong step stool that has a grab bar. Keep electrical cords out of the way. Do not use floor polish or wax that makes floors slippery. If you must use wax, use non-skid floor wax. Do not have throw rugs and other things on the floor that can make you trip. What can I do with my stairs? Do not leave  any items on the stairs. Make sure that there are handrails on both sides of the stairs and use them. Fix handrails that are broken or loose. Make sure that handrails are as long as the stairways. Check any carpeting to make sure that it is firmly attached to the stairs. Fix any carpet that is loose or worn. Avoid having throw rugs at the top or bottom of the stairs. If you do have throw rugs, attach them to the floor with carpet tape. Make sure that you have a light switch at the top of the stairs and the bottom of the stairs. If you do not have them, ask someone to add them for you. What else can I do to help prevent falls? Wear shoes that: Do not have high heels. Have rubber bottoms. Are comfortable and fit you well. Are closed at the toe. Do not wear sandals. If you use a stepladder: Make sure that it is fully opened.  Do not climb a closed stepladder. Make sure that both sides of the stepladder are locked into place. Ask someone to hold it for you, if possible. Clearly mark and make sure that you can see: Any grab bars or handrails. First and last steps. Where the edge of each step is. Use tools that help you move around (mobility aids) if they are needed. These include: Canes. Walkers. Scooters. Crutches. Turn on the lights when you go into a dark area. Replace any light bulbs as soon as they burn out. Set up your furniture so you have a clear path. Avoid moving your furniture around. If any of your floors are uneven, fix them. If there are any pets around you, be aware of where they are. Review your medicines with your doctor. Some medicines can make you feel dizzy. This can increase your chance of falling. Ask your doctor what other things that you can do to help prevent falls. This information is not intended to replace advice given to you by your health care provider. Make sure you discuss any questions you have with your health care provider. Document Released: 10/24/2008 Document Revised: 06/05/2015 Document Reviewed: 02/01/2014 Elsevier Interactive Patient Education  2017 [employer].

## 2022-08-24 NOTE — Progress Notes (Signed)
Subjective:   Katie Bass is a 72 y.o. female who presents for Medicare Annual (Subsequent) preventive examination.  Visit Complete: Virtual  I connected with  Sammuel Hines on 08/24/22 by a audio enabled telemedicine application and verified that I am speaking with the correct person using two identifiers.  Patient Location: Home  Provider Location: Office/Clinic  I discussed the limitations of evaluation and management by telemedicine. The patient expressed understanding and agreed to proceed.  Vital Signs: Unable to obtain new vitals due to this being a telehealth visit.  Patient Medicare AWV questionnaire was completed by the patient on 08/20/2022; I have confirmed that all information answered by patient is correct and no changes since this date.  Review of Systems    Cardiac Risk Factors include: advanced age (>18men, >40 women)     Objective:    Today's Vitals   08/24/22 1305  Weight: 163 lb (73.9 kg)  Height: 5\' 3"  (1.6 m)   Body mass index is 28.87 kg/m.     08/24/2022    1:12 PM 07/16/2021    8:18 AM 12/05/2018    1:25 PM 12/02/2017   12:49 PM 12/29/2015    1:59 PM  Advanced Directives  Does Patient Have a Medical Advance Directive? Yes No No Yes Yes  Type of Estate agent of [place];Living will   Healthcare Power of [place];Living will Living will  Copy of Healthcare Power of Attorney in Chart?    No - copy requested   Would patient like information on creating a medical advance directive?  No - Patient declined Yes (ED - Information included in AVS)      Current Medications (verified) Outpatient Encounter Medications as of 08/24/2022  Medication Sig   albuterol (VENTOLIN HFA) 108 (90 Base) MCG/ACT inhaler INHALE 2 PUFFS BY MOUTH EVERY 6 HOURS AS NEEDED FOR WHEEZING FOR SHORTNESS OF BREATH   Ergocalciferol (VITAMIN D2) 2000 UNITS TABS Take 1 tablet by mouth daily.   fexofenadine (ALLEGRA) 180 MG tablet Take 1 tablet (180 mg total) by  mouth daily.   fluticasone (FLONASE) 50 MCG/ACT nasal spray Place 2 sprays into both nostrils daily.   Multiple Vitamin tablet Take 1 tablet by mouth daily.   Omega-3 Fatty Acids (FISH OIL) 1000 MG CAPS Take by mouth daily.   rosuvastatin (CRESTOR) 5 MG tablet Take 1 tablet by mouth once daily   triamcinolone cream (KENALOG) 0.1 % Apply 1 Application topically 3 (three) times daily.   amoxicillin-clavulanate (AUGMENTIN) 875-125 MG tablet Take 1 tablet by mouth 2 (two) times daily.   No facility-administered encounter medications on file as of 08/24/2022.    Allergies (verified) Sulfa antibiotics   History: Past Medical History:  Diagnosis Date   Actinic keratosis 06/06/2012   R chest 4.0 cm lat to sternum - biopsy proven   Allergy    Asthma    Depression    GERD (gastroesophageal reflux disease)    Past Surgical History:  Procedure Laterality Date   NASAL SINUS SURGERY  01/11/2002   Family History  Problem Relation Age of Onset   Cancer Mother    Breast cancer Sister 47   Cancer Sister    Cancer Sister    Social History   Socioeconomic History   Marital status: Widowed    Spouse name: Not on file   Number of children: 2   Years of education: Not on file   Highest education level: Some college, no degree  Occupational History   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  Tobacco Use   Smoking status: Never    Passive exposure: Yes   Smokeless tobacco: Never   Tobacco comments:    smoked <1pack if cigarettesper day, for 1 year in her 62s  Vaping Use   Vaping status: Never Used  Substance and Sexual Activity   Alcohol use: No   Drug use: No   Sexual activity: Not on file  Other Topics Concern   Not on file  Social History Narrative   Not on file   Social Determinants of Health   Financial Resource Strain: Patient Declined (08/20/2022)   Overall Financial Resource Strain (CARDIA)    Difficulty of Paying Living Expenses: Patient declined  Food Insecurity: Patient  Declined (08/20/2022)   Hunger Vital Sign    Worried About Running Out of Food in the Last Year: Patient declined    Ran Out of Food in the Last Year: Patient declined  Transportation Needs: No Transportation Needs (08/20/2022)   PRAPARE - Administrator, Civil Service (Medical): No    Lack of Transportation (Non-Medical): No  Physical Activity: Sufficiently Active (08/20/2022)   Exercise Vital Sign    Days of Exercise per Week: 3 days    Minutes of Exercise per Session: 120 min  Stress: No Stress Concern Present (08/20/2022)   [employer] of Occupational Health - Occupational Stress Questionnaire    Feeling of Stress : Not at all  Social Connections: Unknown (08/20/2022)   Social Connection and Isolation Panel [NHANES]    Frequency of Communication with Friends and Family: Patient declined    Frequency of Social Gatherings with Friends and Family: Patient declined    Attends Religious Services: Patient declined    Database administrator or Organizations: Patient declined    Attends Banker Meetings: Patient declined    Marital Status: Widowed    Tobacco Counseling Counseling given: Not Answered Tobacco comments: smoked <1pack if cigarettesper day, for 1 year in her 63s   Clinical Intake:  Pre-visit preparation completed: Yes  Pain : No/denies pain     BMI - recorded: 28.87 Nutritional Status: BMI 25 -29 Overweight Nutritional Risks: None Diabetes: No  How often do you need to have someone help you when you read instructions, pamphlets, or other written materials from your doctor or pharmacy?: 1 - Never  Interpreter Needed?: No  Comments: lives alone Information entered by :: B.,LPN   Activities of Daily Living    08/20/2022   11:22 AM 01/21/2022    2:52 PM  In your present state of health, do you have any difficulty performing the following activities:  Hearing?  1  Vision?  0  Difficulty concentrating or making decisions? 0 0   Walking or climbing stairs? 0 0  Dressing or bathing? 0 0  Doing errands, shopping? 0 0  Preparing Food and eating ? N   Using the Toilet? N   In the past six months, have you accidently leaked urine? N   Do you have problems with loss of bowel control? N   Managing your Medications? N   Managing your Finances? N   Housekeeping or managing your Housekeeping? N     Patient Care Team: Alfredia Ferguson, PA-C as PCP - General (Physician Assistant)  Indicate any recent Medical Services you may have received from other than [facility] providers in the past year (date may be approximate).     Assessment:   This is a routine wellness examination for Farzana.  Hearing/Vision screen Hearing Screening - Comments:: Inadequate hearing sometimes Vision Screening - Comments:: Adequate vision Dr Inez Pilgrim  Dietary issues and exercise activities discussed:     Goals Addressed             This Visit's Progress    DIET - EAT MORE FRUITS AND VEGETABLES   On track    Exercise 3x per week (30 min per time)   On track    Recommend to exercise for 3 days a week for at least 30 minutes at a time.        Depression Screen    08/24/2022    1:11 PM 01/21/2022    2:52 PM 10/13/2021   11:00 AM 07/16/2021    8:15 AM 07/10/2020    3:54 PM 12/05/2018    1:28 PM 12/02/2017   12:53 PM  PHQ 2/9 Scores  PHQ - 2 Score 0 0 0 0 0 0 0  PHQ- 9 Score  0 0 0 0  0    Fall Risk    08/20/2022   11:22 AM 01/21/2022    2:52 PM 10/13/2021   10:59 AM 07/16/2021    8:19 AM 07/13/2021    3:48 PM  Fall Risk   Falls in the past year? 0 0 0 0 0  Number falls in past yr:  0 0 0   Injury with Fall?  0 0 0   Risk for fall due to : No Fall Risks No Fall Risks No Fall Risks No Fall Risks   Follow up Falls prevention discussed;Education provided Falls evaluation completed Falls evaluation completed Falls evaluation completed     MEDICARE RISK AT HOME:   TIMED UP AND GO:  Was the test performed?  No    Cognitive  Function:        08/24/2022    1:13 PM  6CIT Screen  What Year? 0 points  What month? 0 points  What time? 0 points  Count back from 20 0 points  Months in reverse 0 points  Repeat phrase 0 points  Total Score 0 points    Immunizations Immunization History  Administered Date(s) Administered   Fluad Quad(high Dose 65+) 10/06/2018, 09/18/2019, 11/05/2020, 09/23/2021   Influenza Whole 10/20/2009   Influenza, High Dose Seasonal PF 10/18/2016, 10/19/2017   Influenza,inj,Quad PF,6+ Mos 10/19/2012, 10/04/2013, 10/10/2014, 10/22/2015   MODERNA COVID-19 SARS-COV-2 PEDS BIVALENT BOOSTER 29yr-85yr 11/13/2020   Moderna Sars-Covid-2 Vaccination 02/25/2019, 03/26/2019   PNEUMOCOCCAL CONJUGATE-20 10/13/2021   Pneumococcal Conjugate-13 12/02/2017    TDAP status: Up to date  Flu Vaccine status: Up to date  Pneumococcal vaccine status: Up to date  Covid-19 vaccine status: Completed vaccines  Qualifies for Shingles Vaccine? Yes   Zostavax completed No   Shingrix Completed?: No.    Education has been provided regarding the importance of this vaccine. Patient has been advised to call insurance company to determine out of pocket expense if they have not yet received this vaccine. Advised may also receive vaccine at local pharmacy or Health Dept. Verbalized acceptance and understanding.  Screening Tests Health Maintenance  Topic Date Due   DTaP/Tdap/Td (1 - Tdap) Never done   Zoster Vaccines- Shingrix (1 of 2) Never done   COVID-19 Vaccine (4 - 2023-24 season) 09/11/2021   INFLUENZA VACCINE  08/12/2022   MAMMOGRAM  11/12/2022   Colonoscopy  06/08/2023   Medicare Annual Wellness (AWV)  08/24/2023   Pneumonia Vaccine 23+ Years old  Completed   DEXA SCAN  Completed  Hepatitis C Screening  Completed   HPV VACCINES  Aged Out    Health Maintenance  Health Maintenance Due  Topic Date Due   DTaP/Tdap/Td (1 - Tdap) Never done   Zoster Vaccines- Shingrix (1 of 2) Never done   COVID-19  Vaccine (4 - 2023-24 season) 09/11/2021   INFLUENZA VACCINE  08/12/2022    Colorectal cancer screening: Type of screening: Colonoscopy. Completed yes. Repeat every 4 years  Mammogram status: Completed yes. Repeat every year  Bone Density status: Completed yes. Results reflect: Bone density results: NORMAL. Repeat every 5 years.  Lung Cancer Screening: (Low Dose CT Chest recommended if Age 48-80 years, 20 pack-year currently smoking OR have quit w/in 15years.) does not qualify.   Lung Cancer Screening Referral: no  Additional Screening:  Hepatitis C Screening: does not qualify; Completed yes  Vision Screening: Recommended annual ophthalmology exams for early detection of glaucoma and other disorders of the eye. Is the patient up to date with their annual eye exam?  Yes  Who is the provider or what is the name of the office in which the patient attends annual eye exams? Dr Inez Pilgrim If pt is not established with a provider, would they like to be referred to a provider to establish care? No .   Dental Screening: Recommended annual dental exams for proper oral hygiene  Diabetic Foot Exam: n/a  Community Resource Referral / Chronic Care Management: CRR required this visit?  No   CCM required this visit?  No     Plan:     I have personally reviewed and noted the following in the patient's chart:   Medical and social history Use of alcohol, tobacco or illicit drugs  Current medications and supplements including opioid prescriptions. Patient is not currently taking opioid prescriptions. Functional ability and status Nutritional status Physical activity Advanced directives List of other physicians Hospitalizations, surgeries, and ER visits in previous 12 months Vitals Screenings to include cognitive, depression, and falls Referrals and appointments  In addition, I have reviewed and discussed with patient certain preventive protocols, quality metrics, and best practice  recommendations. A written personalized care plan for preventive services as well as general preventive health recommendations were provided to patient.     Sue Lush, LPN   06/08/4130   After Visit Summary: (MyChart) Due to this being a telephonic visit, the after visit summary with patients personalized plan was offered to patient via MyChart   Nurse Notes: The patient states she is doing well and has no concerns or questions at this time. Pt made aware of new PCP S. Pardue. She is accepting of the PCP.

## 2022-08-25 ENCOUNTER — Ambulatory Visit: Payer: Medicare HMO | Admitting: Dermatology

## 2022-08-25 VITALS — BP 183/91

## 2022-08-25 DIAGNOSIS — L820 Inflamed seborrheic keratosis: Secondary | ICD-10-CM

## 2022-08-25 DIAGNOSIS — W908XXA Exposure to other nonionizing radiation, initial encounter: Secondary | ICD-10-CM | POA: Diagnosis not present

## 2022-08-25 DIAGNOSIS — L578 Other skin changes due to chronic exposure to nonionizing radiation: Secondary | ICD-10-CM

## 2022-08-25 DIAGNOSIS — L570 Actinic keratosis: Secondary | ICD-10-CM

## 2022-08-25 DIAGNOSIS — L821 Other seborrheic keratosis: Secondary | ICD-10-CM

## 2022-08-25 NOTE — Patient Instructions (Addendum)

## 2022-08-25 NOTE — Progress Notes (Signed)
Follow-Up Visit   Subjective  Katie Bass is a 72 y.o. female who presents for the following: check spots face LN2 to R preauricular, tender spot L nose tender from glasses, red spot nose,  check spot back, irritated by bra, itchy spots R leg, TMC 0.1% cr bid/tid, check spot L leg, check spot R arm The patient has spots, moles and lesions to be evaluated, some may be new or changing and the patient may have concern these could be cancer.   The following portions of the chart were reviewed this encounter and updated as appropriate: medications, allergies, medical history  Review of Systems:  No other skin or systemic complaints except as noted in HPI or Assessment and Plan.  Objective  Well appearing patient in no apparent distress; mood and affect are within normal limits.   A focused examination was performed of the following areas: Face, back, bil legs, R arm  Relevant exam findings are noted in the Assessment and Plan.  L nasal bridge x 1, L nasal ala x 1, R sideburn x 1, R zygoma x 1, L back x 1, R forearm x 2, L thigh x 1, R pretibial x 7 (15) Stuck on waxy paps with erythema  L nasal tip x 1 Pink scaly macules    Assessment & Plan     Inflamed seborrheic keratosis (15) L nasal bridge x 1, L nasal ala x 1, R sideburn x 1, R zygoma x 1, L back x 1, R forearm x 2, L thigh x 1, R pretibial x 7  Symptomatic, irritating, patient would like treated.  Destruction of lesion - L nasal bridge x 1, L nasal ala x 1, R sideburn x 1, R zygoma x 1, L back x 1, R forearm x 2, L thigh x 1, R pretibial x 7 (15) Complexity: simple   Destruction method: cryotherapy   Informed consent: discussed and consent obtained   Timeout:  patient name, date of birth, surgical site, and procedure verified Lesion destroyed using liquid nitrogen: Yes   Region frozen until ice ball extended beyond lesion: Yes   Outcome: patient tolerated procedure well with no complications   Post-procedure  details: wound care instructions given    AK (actinic keratosis) L nasal tip x 1  Actinic keratoses are precancerous spots that appear secondary to cumulative UV radiation exposure/sun exposure over time. They are chronic with expected duration over 1 year. A portion of actinic keratoses will progress to squamous cell carcinoma of the skin. It is not possible to reliably predict which spots will progress to skin cancer and so treatment is recommended to prevent development of skin cancer.  Recommend daily broad spectrum sunscreen SPF 30+ to sun-exposed areas, reapply every 2 hours as needed.  Recommend staying in the shade or wearing long sleeves, sun glasses (UVA+UVB protection) and wide brim hats (4-inch brim around the entire circumference of the hat). Call for new or changing lesions.  Destruction of lesion - L nasal tip x 1 Complexity: simple   Destruction method: cryotherapy   Informed consent: discussed and consent obtained   Timeout:  patient name, date of birth, surgical site, and procedure verified Lesion destroyed using liquid nitrogen: Yes   Region frozen until ice ball extended beyond lesion: Yes   Outcome: patient tolerated procedure well with no complications   Post-procedure details: wound care instructions given    ACTINIC DAMAGE - chronic, secondary to cumulative UV radiation exposure/sun exposure over time - diffuse scaly  erythematous macules with underlying dyspigmentation - Recommend daily broad spectrum sunscreen SPF 30+ to sun-exposed areas, reapply every 2 hours as needed.  - Recommend staying in the shade or wearing long sleeves, sun glasses (UVA+UVB protection) and wide brim hats (4-inch brim around the entire circumference of the hat). - Call for new or changing lesions.   SEBORRHEIC KERATOSIS - Stuck-on, waxy, tan-brown papules and/or plaques  - Benign-appearing - Discussed benign etiology and prognosis. - Observe - Call for any changes    Return in  about 6 months (around 02/25/2023) for AK f/u, ISK f/u.  I, Ardis Rowan, RMA, am acting as scribe for Armida Sans, MD .   Documentation: I have reviewed the above documentation for accuracy and completeness, and I agree with the above.  Armida Sans, MD

## 2022-08-31 ENCOUNTER — Other Ambulatory Visit: Payer: Self-pay | Admitting: Physician Assistant

## 2022-08-31 DIAGNOSIS — J45909 Unspecified asthma, uncomplicated: Secondary | ICD-10-CM

## 2022-09-05 ENCOUNTER — Encounter: Payer: Self-pay | Admitting: Dermatology

## 2022-09-13 ENCOUNTER — Other Ambulatory Visit: Payer: Self-pay | Admitting: Physician Assistant

## 2022-09-20 ENCOUNTER — Other Ambulatory Visit: Payer: Self-pay | Admitting: Family

## 2022-09-20 DIAGNOSIS — J45909 Unspecified asthma, uncomplicated: Secondary | ICD-10-CM

## 2022-09-27 ENCOUNTER — Telehealth: Payer: Self-pay | Admitting: Family Medicine

## 2022-09-27 NOTE — Telephone Encounter (Signed)
[facility] faxed refill request for the following medications:  Proair HFA (albuterol HFA )   Please advise.

## 2022-09-28 NOTE — Telephone Encounter (Signed)
LVMTCB. CRM created. Ok for PEC to advise 

## 2022-09-30 ENCOUNTER — Encounter: Payer: Self-pay | Admitting: Family Medicine

## 2022-09-30 ENCOUNTER — Ambulatory Visit (INDEPENDENT_AMBULATORY_CARE_PROVIDER_SITE_OTHER): Payer: Medicare HMO | Admitting: Family Medicine

## 2022-09-30 VITALS — BP 130/63 | HR 81 | Ht 63.0 in | Wt 172.8 lb

## 2022-09-30 DIAGNOSIS — W19XXXA Unspecified fall, initial encounter: Secondary | ICD-10-CM | POA: Diagnosis not present

## 2022-09-30 DIAGNOSIS — Z1231 Encounter for screening mammogram for malignant neoplasm of breast: Secondary | ICD-10-CM

## 2022-09-30 DIAGNOSIS — S61419A Laceration without foreign body of unspecified hand, initial encounter: Secondary | ICD-10-CM

## 2022-09-30 DIAGNOSIS — Z23 Encounter for immunization: Secondary | ICD-10-CM

## 2022-09-30 NOTE — Progress Notes (Signed)
Acute Office Visit  Subjective:     Patient ID: Katie Bass, female    DOB: 1950-10-06, 72 y.o.   MRN: [id number]  Chief Complaint  Patient presents with   Hand Injury    Left hand injuried due to a fall just wants hand checked and see if something can help with soreness    HPI Discussed the use of AI scribe software for clinical note transcription with the patient, who gave verbal consent to proceed.  History of Present Illness   The patient, with a history of hypertension, presents with a hand laceration sustained five days ago. They tripped on a mat and fell onto brick steps while carrying a cake. The laceration is located on the hand and is described as 'very, very, very sore.' They can move their fingers but cannot make a fist due to the skin pulling apart. They deny tenderness in the fingers or bones of the hand. They have been managing the wound at home with hydrogen peroxide and warm salt water soaks, but have since stopped these treatments to allow the wound to heal. They report the wound is still draining a yellowish substance.  The patient also mentions a scheduled mammogram in November and expresses a desire for yearly mammograms due to a family history of breast cancer. They also discuss a previous tetanus shot and whether they need a new one.       ROS per HPI      Objective:    BP 130/63 Comment: home reading  Pulse 81   Ht 5\' 3"  (1.6 m)   Wt 172 lb 12.8 oz (78.4 kg)   SpO2 96%   BMI 30.61 kg/m    Physical Exam  Physical Exam   VITALS: BP- 130/63 EXTREMITIES: No tenderness along the bones of the hand. Ability to move fingers without pain, except when attempting to form a knuckle which causes skin tension and pain. Presence of granulation tissue indicating healing, with areas of skin retraction and black necrotic tissue on edges likely to slough off. No edema or cyanosis noted. No tenderness along the elbow or leg, minor abrasions present.       No  results found for any visits on 09/30/22.      Assessment & Plan:   Problem List Items Addressed This Visit   None Visit Diagnoses     Laceration of dorsum of hand    -  Primary   Relevant Orders   Tdap vaccine greater than or equal to 7yo IM (Completed)   Fall, initial encounter       Breast cancer screening by mammogram       Relevant Orders   MM 3D SCREENING MAMMOGRAM BILATERAL BREAST   Influenza vaccine needed       Relevant Orders   Flu Vaccine Trivalent High Dose (Fluad) (Completed)         Hand Laceration Sustained 5 days ago, no signs of infection or fracture. Healing with granulation tissue. Discussed appropriate wound care and the importance of not using hydrogen peroxide or soaking the wound. -Continue to allow wound to heal naturally. -Apply Aquaphor or Vaseline to aid in healing. -Administer tetanus shot today due to laceration.  Hypertension Reports home blood pressure readings of 130/63. -Continue current management.  Breast Cancer Screening Family history of breast cancer. Discussed the importance of annual mammograms. -Schedule mammogram for November 2024.  General Health Maintenance / Followup Plans -Schedule physical for January 2025.  No orders of the defined types were placed in this encounter.   No follow-ups on file.  Shirlee Latch, MD

## 2022-10-01 ENCOUNTER — Other Ambulatory Visit: Payer: Self-pay

## 2022-10-01 MED ORDER — ALBUTEROL SULFATE HFA 108 (90 BASE) MCG/ACT IN AERS: 2.0000 | INHALATION_SPRAY | Freq: Four times a day (QID) | RESPIRATORY_TRACT | 3 refills | Status: AC | PRN

## 2022-11-23 ENCOUNTER — Ambulatory Visit: Payer: Medicare HMO | Admitting: Family Medicine

## 2022-11-23 VITALS — BP 129/70 | HR 68 | Ht 63.0 in | Wt 171.0 lb

## 2022-11-23 DIAGNOSIS — M791 Myalgia, unspecified site: Secondary | ICD-10-CM

## 2022-11-23 DIAGNOSIS — J0140 Acute pansinusitis, unspecified: Secondary | ICD-10-CM | POA: Diagnosis not present

## 2022-11-23 DIAGNOSIS — T466X5A Adverse effect of antihyperlipidemic and antiarteriosclerotic drugs, initial encounter: Secondary | ICD-10-CM

## 2022-11-23 DIAGNOSIS — H66002 Acute suppurative otitis media without spontaneous rupture of ear drum, left ear: Secondary | ICD-10-CM | POA: Diagnosis not present

## 2022-11-23 MED ORDER — AMOXICILLIN-POT CLAVULANATE 875-125 MG PO TABS: 1.0000 | ORAL_TABLET | Freq: Two times a day (BID) | ORAL | 0 refills | Status: AC

## 2022-11-23 NOTE — Progress Notes (Addendum)
Acute Office Visit  Subjective:     Patient ID: Katie Bass, female    DOB: Mar 23, 1950, 72 y.o.   MRN: [id number]  Chief Complaint  Patient presents with   Sinus Problem    Symptoms: Sinus headaches, nasal congestion, sore throat, and tenderness under left ear. Patient reports her left is bothering her for about 7-8 days but sinus concerns have been present for 2 weeks now. Patient has taking flonase, and her regular antihistamines. Patient reports her symptoms are worse in the mornings.     Sinus Problem   Discussed the use of AI scribe software for clinical note transcription with the patient, who gave verbal consent to proceed.  History of Present Illness   The patient, with a history of hypertension and hyperlipidemia, presents with a two-week history of sinus problems and left ear discomfort. The patient describes the ear discomfort as "cracking and popping" and reports that the pain was so severe that it hurt to move their hair. The patient has been self-medicating with over-the-counter cold and allergy medicine, which has provided some relief, but the symptoms have not fully resolved. The patient also reports a lot of pressure in the sinuses and stuffiness, especially in the mornings. The patient has to sit on the side of the bed to get their bearing due to vertigo. The patient has been using Flonase and antihistamines for symptom management. The patient also reports high blood pressure readings at home and during clinic visits, despite feeling more relaxed at home. The patient has stopped taking rosuvastatin due to side effects, including swelling and joint pain.       ROS      Objective:    BP 129/70 Comment: home reading  Pulse 68   Ht 5\' 3"  (1.6 m)   Wt 171 lb (77.6 kg)   SpO2 98%   BMI 30.29 kg/m    Physical Exam Vitals reviewed.  Constitutional:      General: She is not in acute distress.    Appearance: Normal appearance. She is well-developed. She is not  diaphoretic.  HENT:     Head: Normocephalic and atraumatic.     Right Ear: Tympanic membrane, ear canal and external ear normal.     Left Ear: Ear canal and external ear normal. Tympanic membrane is erythematous and bulging.     Nose: Congestion present.     Right Sinus: Maxillary sinus tenderness and frontal sinus tenderness present.     Left Sinus: Maxillary sinus tenderness and frontal sinus tenderness present.     Mouth/Throat:     Mouth: Mucous membranes are moist.     Pharynx: Oropharynx is clear. No oropharyngeal exudate.  Eyes:     General: No scleral icterus.    Conjunctiva/sclera: Conjunctivae normal.     Pupils: Pupils are equal, round, and reactive to light.  Cardiovascular:     Rate and Rhythm: Normal rate and regular rhythm.     Pulses: Normal pulses.     Heart sounds: Normal heart sounds. No murmur heard. Pulmonary:     Effort: Pulmonary effort is normal. No respiratory distress.     Breath sounds: Normal breath sounds. No wheezing or rales.  Musculoskeletal:     Cervical back: Neck supple.     Right lower leg: No edema.     Left lower leg: No edema.  Lymphadenopathy:     Cervical: No cervical adenopathy.  Skin:    General: Skin is warm and dry.  Findings: No rash.  Neurological:     Mental Status: She is alert.     No results found for any visits on 11/23/22.      Assessment & Plan:   Problem List Items Addressed This Visit       Other   Myalgia due to statin   Other Visit Diagnoses     Non-recurrent acute suppurative otitis media of left ear without spontaneous rupture of tympanic membrane    -  Primary   Relevant Medications   amoxicillin-clavulanate (AUGMENTIN) 875-125 MG tablet   Acute non-recurrent pansinusitis       Relevant Medications   amoxicillin-clavulanate (AUGMENTIN) 875-125 MG tablet           Sinusitis Chronic sinusitis with a two-week duration, presenting with significant pressure, stuffiness, and vertigo. Symptoms have  not improved with OTC medications. Augmentin is preferred due to its efficacy in treating both sinus and ear infections. Ten days prescribed due to history of requiring longer treatment. - Prescribe Augmentin 875 mg/125 mg, one pill twice a day for 10 days - Continue Flonase, two sprays in each nostril once daily - Use Tylenol for pain management - Advise to report if symptoms do not improve or worsen  Otitis Media Left ear infection with cracking, popping, and significant pain. Confirmed upon physical examination. Augmentin is preferred due to its efficacy in treating both sinus and ear infections. - Prescribe Augmentin 875 mg/125 mg, one pill twice a day for 10 days (same prescription as for sinusitis) - Use Tylenol for pain management - Advise to report if symptoms do not improve or worsen  Hypertension Elevated blood pressure readings both in the clinic and at home. Consistent high readings since September. Home readings generally in the 120s/70s, but clinic readings are higher due to anxiety. - Monitor blood pressure at home - Review blood pressure readings during next visit  Myalgia due to Statin Discontinued rosuvastatin due to severe muscle pain and swelling. Symptoms resolved after discontinuation. Documented myalgia due to statin to avoid future prescriptions of this class. - Document myalgia due to statin in chart - Avoid prescribing statins in the future  General Health Maintenance Due for a physical examination and has a scheduled mammogram. - Schedule physical examination for January 12 or later - Confirm mammogram appointment for November 19  Follow-up - Schedule physical examination for January 12 or later - Confirm mammogram appointment for November 19 - Advise to report if symptoms do not improve or worsen.        Meds ordered this encounter  Medications   amoxicillin-clavulanate (AUGMENTIN) 875-125 MG tablet    Sig: Take 1 tablet by mouth 2 (two) times daily  for 10 days.    Dispense:  20 tablet    Refill:  0    Return in about 2 months (around 01/23/2023) for CPE.  Shirlee Latch, MD  Addendum: Patient states: "I told Dr. B. that my blood pressure reading is much lower when I take it at home. I also presented a written account of my blood pressure readings everyday at home since Sept. 2024; much lower each day. I told Dr. Leonard Schwartz. that my blood pressure is always much higher at my clinical visits."  Do recall that patients home BPs are much lower than clinic visits and this was a scribe error.

## 2022-11-30 ENCOUNTER — Ambulatory Visit
Admission: RE | Admit: 2022-11-30 | Discharge: 2022-11-30 | Disposition: A | Discharge status: Home / Self Care | Payer: Medicare HMO | Source: Ambulatory Visit | Attending: Family Medicine | Admitting: Family Medicine

## 2022-11-30 DIAGNOSIS — Z1231 Encounter for screening mammogram for malignant neoplasm of breast: Secondary | ICD-10-CM | POA: Diagnosis not present

## 2023-03-01 ENCOUNTER — Ambulatory Visit: Payer: Medicare HMO | Admitting: Dermatology

## 2023-03-01 DIAGNOSIS — D2339 Other benign neoplasm of skin of other parts of face: Secondary | ICD-10-CM

## 2023-03-01 DIAGNOSIS — W908XXA Exposure to other nonionizing radiation, initial encounter: Secondary | ICD-10-CM | POA: Diagnosis not present

## 2023-03-01 DIAGNOSIS — L738 Other specified follicular disorders: Secondary | ICD-10-CM | POA: Diagnosis not present

## 2023-03-01 DIAGNOSIS — L720 Epidermal cyst: Secondary | ICD-10-CM | POA: Diagnosis not present

## 2023-03-01 DIAGNOSIS — L821 Other seborrheic keratosis: Secondary | ICD-10-CM | POA: Diagnosis not present

## 2023-03-01 DIAGNOSIS — L578 Other skin changes due to chronic exposure to nonionizing radiation: Secondary | ICD-10-CM

## 2023-03-01 DIAGNOSIS — Z7189 Other specified counseling: Secondary | ICD-10-CM

## 2023-03-01 DIAGNOSIS — L820 Inflamed seborrheic keratosis: Secondary | ICD-10-CM

## 2023-03-01 DIAGNOSIS — L729 Follicular cyst of the skin and subcutaneous tissue, unspecified: Secondary | ICD-10-CM

## 2023-03-01 NOTE — Progress Notes (Unsigned)
   Follow-Up Visit   Subjective  Katie Bass is a 73 y.o. female who presents for the following: 6 month ak and isk follow up. Patient reports a spot inside left nare , rough scaly spot at left side of hairline / forehead and a dark spot at right thigh.   The patient has spots, moles and lesions to be evaluated, some may be new or changing and the patient may have concern these could be cancer.  The following portions of the chart were reviewed this encounter and updated as appropriate: medications, allergies, medical history  Review of Systems:  No other skin or systemic complaints except as noted in HPI or Assessment and Plan.  Objective  Well appearing patient in no apparent distress; mood and affect are within normal limits.  A focused examination was performed of the following areas: Nose, face, right thigh  Relevant exam findings are noted in the Assessment and Plan.       left nasal ala x 1, left forehead x 1, right preauricular x 2, left nasal bridge x 1, right lateral thigh x 1 (6) Erythematous stuck-on, waxy papule or plaque  Assessment & Plan   Sebaceous Hyperplasia with Milia at Left Nasal Septum  - Small yellow papules with a central dell - Benign-appearing - Observe. Call for changes.  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 Observe. Treated with LN2 today to see if we can shrink smaller. Dr. Katrinka Blazing examined with dermatoscope and we agree it is benign appearing most likely consistent with fibrous papule / angiofibroma  SEBORRHEIC KERATOSIS - Stuck-on, waxy, tan-brown papules and/or plaques  - Benign-appearing - Discussed benign etiology and prognosis. - Observe - Call for any changes  ACTINIC DAMAGE - chronic, secondary to cumulative UV radiation exposure/sun exposure over time - diffuse scaly erythematous macules with underlying dyspigmentation - Recommend  daily broad spectrum sunscreen SPF 30+ to sun-exposed areas, reapply every 2 hours as needed.  - Recommend staying in the shade or wearing long sleeves, sun glasses (UVA+UVB protection) and wide brim hats (4-inch brim around the entire circumference of the hat). - Call for new or changing lesions.  INFLAMED SEBORRHEIC KERATOSIS (6) left nasal ala x 1, left forehead x 1, right preauricular x 2, left nasal bridge x 1, right lateral thigh x 1 (6) Symptomatic, irritating, patient would like treated.  Isk vs sebaceous hyperplasia at left nasal ala and left nasal bridge  Destruction of lesion - left nasal ala x 1, left forehead x 1, right preauricular x 2, left nasal bridge x 1, right lateral thigh x 1 (6) Complexity: simple   Destruction method: cryotherapy   Informed consent: discussed and consent obtained   Timeout:  patient name, date of birth, surgical site, and procedure verified Lesion destroyed using liquid nitrogen: Yes   Region frozen until ice ball extended beyond lesion: Yes   Outcome: patient tolerated procedure well with no complications   Post-procedure details: wound care instructions given    Return in about 1 year (around 02/29/2024) for TBSE and ak follow up.  IAsher Muir, CMA, am acting as scribe for Armida Sans, MD.   Documentation: I have reviewed the above documentation for accuracy and completeness, and I agree with the above.  Armida Sans, MD

## 2023-03-01 NOTE — Patient Instructions (Addendum)

## 2023-03-02 ENCOUNTER — Encounter: Payer: Self-pay | Admitting: Dermatology

## 2023-06-20 ENCOUNTER — Other Ambulatory Visit: Payer: Self-pay | Admitting: Physician Assistant

## 2023-06-20 DIAGNOSIS — J3089 Other allergic rhinitis: Secondary | ICD-10-CM

## 2023-08-30 ENCOUNTER — Ambulatory Visit: Payer: Medicare HMO

## 2023-08-30 DIAGNOSIS — Z0000 Encounter for general adult medical examination without abnormal findings: Secondary | ICD-10-CM

## 2023-08-30 DIAGNOSIS — Z1231 Encounter for screening mammogram for malignant neoplasm of breast: Secondary | ICD-10-CM | POA: Diagnosis not present

## 2023-08-30 NOTE — Progress Notes (Signed)
 Subjective:   Katie Bass is a 73 y.o. who presents for a Medicare Wellness preventive visit.  As a reminder, Annual Wellness Visits don't include a physical exam, and some assessments may be limited, especially if this visit is performed virtually. We may recommend an in-person follow-up visit with your provider if needed.  Visit Complete: Virtual I connected with  Katie Bass on 08/30/23 by a audio enabled telemedicine application and verified that I am speaking with the correct person using two identifiers.  Patient Location: Home  Provider Location: Office/Clinic  I discussed the limitations of evaluation and management by telemedicine. The patient expressed understanding and agreed to proceed.  Vital Signs: Because this visit was a virtual/telehealth visit, some criteria may be missing or patient reported. Any vitals not documented were not able to be obtained and vitals that have been documented are patient reported.  VideoDeclined- This patient declined Librarian, academic. Therefore the visit was completed with audio only.  Persons Participating in Visit: Patient.  AWV Questionnaire: No: Patient Medicare AWV questionnaire was not completed prior to this visit.  Cardiac Risk Factors include: advanced age (>61men, >38 women);dyslipidemia;sedentary lifestyle     Objective:    There were no vitals filed for this visit. There is no height or weight on file to calculate BMI.     08/30/2023    3:14 PM 08/24/2022    1:12 PM 07/16/2021    8:18 AM 12/05/2018    1:25 PM 12/02/2017   12:49 PM 12/29/2015    1:59 PM  Advanced Directives  Does Patient Have a Medical Advance Directive? No Yes No No Yes  Yes   Type of Furniture conservator/restorer;Living will   Healthcare Power of [place];Living will Living will  Copy of Healthcare Power of Attorney in Chart?     No - copy requested    Would patient like information on creating a  medical advance directive? No - Patient declined  No - Patient declined Yes (ED - Information included in AVS)       Data saved with a previous flowsheet row definition    Current Medications (verified) Outpatient Encounter Medications as of 08/30/2023  Medication Sig   albuterol  (VENTOLIN  HFA) 108 (90 Base) MCG/ACT inhaler INHALE 2 PUFFS BY MOUTH EVERY 6 HOURS AS NEEDED FOR WHEEZING AND FOR SHORTNESS OF BREATH   albuterol  (VENTOLIN  HFA) 108 (90 Base) MCG/ACT inhaler Inhale 2 puffs into the lungs every 6 (six) hours as needed for wheezing or shortness of breath.   Ergocalciferol  (VITAMIN D2) 2000 UNITS TABS Take 1 tablet by mouth daily.   fexofenadine  (ALLEGRA ) 180 MG tablet Take 1 tablet (180 mg total) by mouth daily.   fluticasone  (FLONASE ) 50 MCG/ACT nasal spray Use 2 spray(s) in each nostril once daily   Multiple Vitamin tablet Take 1 tablet by mouth daily.   Omega-3 Fatty Acids (FISH OIL) 1000 MG CAPS Take by mouth daily.   No facility-administered encounter medications on file as of 08/30/2023.    Allergies (verified) Sulfa antibiotics   History: Past Medical History:  Diagnosis Date   Actinic keratosis 06/06/2012   R chest 4.0 cm lat to sternum - biopsy proven   Allergy    Asthma    Depression    GERD (gastroesophageal reflux disease)    Past Surgical History:  Procedure Laterality Date   NASAL SINUS SURGERY  01/11/2002   Family History  Problem Relation Age of Onset   Cancer Mother  Breast cancer Sister 27   Cancer Sister    Fibrocystic breast disease Sister    Cancer Sister    Social History   Socioeconomic History   Marital status: Widowed    Spouse name: Not on file   Number of children: 2   Years of education: Not on file   Highest education level: Some college, no degree  Occupational History   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  Tobacco Use   Smoking status: Never    Passive exposure: Yes   Smokeless tobacco: Never   Tobacco comments:    smoked  <1pack if cigarettesper day, for 1 year in her 57s  Vaping Use   Vaping status: Never Used  Substance and Sexual Activity   Alcohol use: No   Drug use: No   Sexual activity: Not on file  Other Topics Concern   Not on file  Social History Narrative   Not on file   Social Drivers of Health   Financial Resource Strain: Low Risk  (08/30/2023)   Overall Financial Resource Strain (CARDIA)    Difficulty of Paying Living Expenses: Not hard at all  Food Insecurity: No Food Insecurity (08/30/2023)   Hunger Vital Sign    Worried About Running Out of Food in the Last Year: Never true    Ran Out of Food in the Last Year: Never true  Transportation Needs: No Transportation Needs (08/30/2023)   PRAPARE - Administrator, Civil Service (Medical): No    Lack of Transportation (Non-Medical): No  Physical Activity: Insufficiently Active (08/30/2023)   Exercise Vital Sign    Days of Exercise per Week: 2 days    Minutes of Exercise per Session: 20 min  Stress: No Stress Concern Present (08/30/2023)   [employer] of Occupational Health - Occupational Stress Questionnaire    Feeling of Stress: Not at all  Social Connections: Unknown (08/30/2023)   Social Connection and Isolation Panel    Frequency of Communication with Friends and Family: Patient declined    Frequency of Social Gatherings with Friends and Family: Patient declined    Attends Religious Services: Patient declined    Database administrator or Organizations: Patient declined    Attends Banker Meetings: Patient declined    Marital Status: Widowed    Tobacco Counseling Counseling given: Not Answered Tobacco comments: smoked <1pack if cigarettesper day, for 1 year in her 34s    Clinical Intake:  Pre-visit preparation completed: Yes  Pain : No/denies pain     Nutritional Risks: None Diabetes: No  Lab Results  Component Value Date   HGBA1C 5.5 01/25/2018   HGBA1C 5.2 12/29/2015     How often do  you need to have someone help you when you read instructions, pamphlets, or other written materials from your doctor or pharmacy?: 1 - Never  Interpreter Needed?: No  Information entered by :: Katie Bass, Katie Bass   Activities of Daily Living     08/30/2023    3:15 PM  In your present state of health, do you have any difficulty performing the following activities:  Hearing? 1  Vision? 0  Difficulty concentrating or making decisions? 0  Walking or climbing stairs? 0  Dressing or bathing? 0  Doing errands, shopping? 0  Preparing Food and eating ? N  Using the Toilet? N  In the past six months, have you accidently leaked urine? N  Do you have problems with loss of bowel control? N  Managing your Medications?  N  Managing your Finances? N  Housekeeping or managing your Housekeeping? N    Patient Care Team: Myrla Jon HERO, MD as PCP - General (Family Medicine) Mittie Gaskin, MD as Referring Physician (Ophthalmology)  I have updated your Care Teams any recent Medical Services you may have received from other providers in the past year.     Assessment:   This is a routine wellness examination for Katie Bass.  Hearing/Vision screen Hearing Screening - Comments:: NO AIDS Vision Screening - Comments:: WEARS GLASSES ALL DAY- DR.BRASINGTON- DUE FOR AN APPT   Goals Addressed             This Visit's Progress    DIET - INCREASE WATER INTAKE         Depression Screen     08/30/2023    3:13 PM 08/24/2022    1:11 PM 01/21/2022    2:52 PM 10/13/2021   11:00 AM 07/16/2021    8:15 AM 07/10/2020    3:54 PM 12/05/2018    1:28 PM  PHQ 2/9 Scores  PHQ - 2 Score 0 0 0 0 0 0 0  PHQ- 9 Score 0  0 0 0 0     Fall Risk     08/30/2023    3:15 PM 08/20/2022   11:22 AM 01/21/2022    2:52 PM 10/13/2021   10:59 AM 07/16/2021    8:19 AM  Fall Risk   Falls in the past year? 0 0 0 0 0  Number falls in past yr: 0  0 0 0  Injury with Fall? 0  0 0 0  Risk for fall due to : No Fall Risks No  Fall Risks No Fall Risks No Fall Risks No Fall Risks  Follow up Falls evaluation completed;Falls prevention discussed Falls prevention discussed;Education provided Falls evaluation completed  Falls evaluation completed  Falls evaluation completed      Data saved with a previous flowsheet row definition    MEDICARE RISK AT HOME:  Medicare Risk at Home Any stairs in or around the home?: Yes If so, are there any without handrails?: No Home free of loose throw rugs in walkways, pet beds, electrical cords, etc?: Yes Adequate lighting in your home to reduce risk of falls?: Yes Life alert?: No Use of a cane, walker or w/c?: No Grab bars in the bathroom?: Yes Shower chair or bench in shower?: Yes Elevated toilet seat or a handicapped toilet?: Yes  TIMED UP AND GO:  Was the test performed?  No  Cognitive Function: 6CIT completed        08/30/2023    3:18 PM 08/24/2022    1:13 PM  6CIT Screen  What Year? 0 points 0 points  What month? 0 points 0 points  What time? 0 points 0 points  Count back from 20 0 points 0 points  Months in reverse 0 points 0 points  Repeat phrase 0 points 0 points  Total Score 0 points 0 points    Immunizations Immunization History  Administered Date(s) Administered   Fluad Quad(high Dose 65+) 10/06/2018, 09/18/2019, 11/05/2020, 09/23/2021   Fluad Trivalent(High Dose 65+) 09/30/2022   Influenza Whole 10/20/2009   Influenza, High Dose Seasonal PF 10/18/2016, 10/19/2017   Influenza,inj,Quad PF,6+ Mos 10/19/2012, 10/04/2013, 10/10/2014, 10/22/2015   MODERNA COVID-19 SARS-COV-2 PEDS BIVALENT BOOSTER 10yr-53yr 11/13/2020   Moderna Sars-Covid-2 Vaccination 02/25/2019, 03/26/2019   PNEUMOCOCCAL CONJUGATE-20 10/13/2021   Pneumococcal Conjugate-13 12/02/2017   Tdap 09/30/2022    Screening Tests Health Maintenance  Topic Date Due   Zoster Vaccines- Shingrix (1 of 2) Never done   COVID-19 Vaccine (4 - 2024-25 season) 09/12/2022   Colonoscopy  06/08/2023    INFLUENZA VACCINE  08/12/2023   MAMMOGRAM  11/30/2023   Medicare Annual Wellness (AWV)  08/29/2024   DEXA SCAN  07/24/2025   DTaP/Tdap/Td (2 - Td or Tdap) 09/29/2032   Pneumococcal Vaccine: 50+ Years  Completed   Hepatitis C Screening  Completed   HPV VACCINES  Aged Out   Meningococcal B Vaccine  Aged Out    Health Maintenance  Health Maintenance Due  Topic Date Due   Zoster Vaccines- Shingrix (1 of 2) Never done   COVID-19 Vaccine (4 - 2024-25 season) 09/12/2022   Colonoscopy  06/08/2023   INFLUENZA VACCINE  08/12/2023   Health Maintenance Items Addressed: Mammogram ordered. BDS UP TO DATE; DECLINES COLONOSCOPY REFERRAL; NEEDS SHINGRIX; UP TO DATE ON PNA & DTAP; DOESN'T WANT ANYMORE COVIDS   Additional Screening:  Vision Screening: Recommended annual ophthalmology exams for early detection of glaucoma and other disorders of the eye. Would you like a referral to an eye doctor? No    Dental Screening: Recommended annual dental exams for proper oral hygiene  Community Resource Referral / Chronic Care Management: CRR required this visit?  No   CCM required this visit?  No   Plan:    I have personally reviewed and noted the following in the patient's chart:   Medical and social history Use of alcohol, tobacco or illicit drugs  Current medications and supplements including opioid prescriptions. Patient is not currently taking opioid prescriptions. Functional ability and status Nutritional status Physical activity Advanced directives List of other physicians Hospitalizations, surgeries, and ER visits in previous 12 months Vitals Screenings to include cognitive, depression, and falls Referrals and appointments  In addition, I have reviewed and discussed with patient certain preventive protocols, quality metrics, and best practice recommendations. A written personalized care plan for preventive services as well as general preventive health recommendations were provided  to patient.   Katie GORMAN Bass, Katie Bass   1[birth year]74   After Visit Summary: (MyChart) Due to this being a telephonic visit, the after visit summary with patients personalized plan was offered to patient via MyChart   Notes: MAMMOGRAM ORDERED

## 2023-08-30 NOTE — Patient Instructions (Addendum)
 Katie Bass , Thank you for taking time out of your busy schedule to complete your Annual Wellness Visit with me. I enjoyed our conversation and look forward to speaking with you again next year. I, as well as your care team,  appreciate your ongoing commitment to your health goals. Please review the following plan we discussed and let me know if I can assist you in the future.  REFERRAL SENT FOR MAMMOGRAM You have an order for:  []   2D Mammogram  [x]   3D Mammogram  []   Bone Density     Please call for appointment:  [facility] Katie Bass  [address] [place] [address] [phone] Katie Bass [address] [place], [place] [address] [phone] Katie Bass [address] [place] Katie Bass [place], [place] [address] [phone]   Make sure to wear two-piece clothing.  No lotions, powders, or deodorants the day of the appointment. Make sure to bring picture ID and insurance card.  Bring list of medications you are currently taking including any supplements.   Schedule your [place] screening mammogram through MyChart!   Log into your MyChart account.  Go to 'Visit' (or 'Appointments' if on mobile App) --> Schedule an Appointment  Under 'Select a Reason for Visit' choose the Mammogram Screening option.  Complete the pre-visit questions and select the time and place that best fits your schedule.   Follow up Visits: 09/04/24 @ 1:10 PM BY PHONE We will see or speak with you next year for your Next Medicare AWV with our clinical staff Have you seen your provider in the last 6 months (3 months if uncontrolled diabetes)? Yes  Clinician Recommendations:  Aim for 30 minutes of exercise or brisk walking, 6-8 glasses of water, and 5 servings of fruits and vegetables each day. TAKE CARE!      This is a list of the screenings recommended for you:  Health Maintenance  Topic Date  Due   Zoster (Shingles) Vaccine (1 of 2) Never done   COVID-19 Vaccine (4 - 2024-25 season) 09/12/2022   Colon Cancer Screening  06/08/2023   Flu Shot  08/12/2023   Mammogram  11/30/2023   Medicare Annual Wellness Visit  08/29/2024   DEXA scan (bone density measurement)  07/24/2025   DTaP/Tdap/Td vaccine (2 - Td or Tdap) 09/29/2032   Pneumococcal Vaccine for age over 29  Completed   Hepatitis C Screening  Completed   HPV Vaccine  Aged Out   Meningitis B Vaccine  Aged Out    Advanced directives: (ACP Link)Information on Advanced Care Planning can be found at [facility]  Print production planner Health Care Directives Advance Health Care Directives. [email or web address]  Advance Care Planning is important because it:  [x]  Makes sure you receive the medical care that is consistent with your values, goals, and preferences  [x]  It provides guidance to your family and loved ones and reduces their decisional burden about whether or not they are making the right decisions based on your wishes.  Follow the link provided in your after visit summary or read over the paperwork we have mailed to you to help you started getting your Advance Directives in place. If you need assistance in completing these, please reach out to us  so that we can help you!

## 2023-09-01 ENCOUNTER — Other Ambulatory Visit: Payer: Self-pay | Admitting: Family Medicine

## 2023-09-01 MED ORDER — MECLIZINE HCL 25 MG PO TABS: 25.0000 mg | ORAL_TABLET | Freq: Three times a day (TID) | ORAL | 0 refills | Status: AC | PRN

## 2023-09-01 NOTE — Progress Notes (Signed)
 Asked NHA at [facility] for refill or prn meclizine . Rx sent

## 2023-09-06 ENCOUNTER — Encounter: Payer: Self-pay | Admitting: Family Medicine

## 2023-09-06 ENCOUNTER — Ambulatory Visit (INDEPENDENT_AMBULATORY_CARE_PROVIDER_SITE_OTHER): Admitting: Family Medicine

## 2023-09-06 VITALS — BP 169/72 | HR 72 | Temp 97.7°F | Ht 63.0 in | Wt 171.3 lb

## 2023-09-06 DIAGNOSIS — H60502 Unspecified acute noninfective otitis externa, left ear: Secondary | ICD-10-CM | POA: Diagnosis not present

## 2023-09-06 DIAGNOSIS — H8122 Vestibular neuronitis, left ear: Secondary | ICD-10-CM

## 2023-09-06 MED ORDER — METHYLPREDNISOLONE 4 MG PO TBPK: ORAL_TABLET | ORAL | 0 refills | Status: AC

## 2023-09-06 MED ORDER — CIPROFLOXACIN-DEXAMETHASONE 0.3-0.1 % OT SUSP: 4.0000 [drp] | Freq: Two times a day (BID) | OTIC | 0 refills | Status: AC

## 2023-09-06 NOTE — Progress Notes (Signed)
 Acute Office Visit  Subjective:     Patient ID: Katie Bass, female    DOB: [birth year], 73 y.o.   MRN: [id number]  Chief Complaint  Patient presents with   Acute Visit    Patient reports that she is here for vertigo and being dizzy 5 days.  States that she has got the dizziness under control.   Her main concern is her left ear when putting a q-tip in it it seems to be painful.  States that the ear sounds squeaky and this all started on Sunday. Painful below ear as well.    Katie Bass is a 73 yo F with a history of vertigo and allergic rhinitis who presents to the clinic acutely for inner ear pain, headache, and vertigo.  On interview, she reports a history of recurring vertigo. She states that over the last 20 years, her vertigo has worsened. Most recently, she reports experiencing a 5 day history of vertigo well controlled with her normal regime of meclizine  and rest. Although her vertigo has mostly resolved, she describes feeling inner ear pain causing headache, ear fullness, hearing loss, and occasional lightheadedness. She describes the pain as constant and radiating around her ear. She endorses feeling sick with a little bit of sinus pressure, sore throat, and cough prior to her most recent episode of vertigo. She denies any fevers, chills, or sweating today.  She reports no other concerns.    ROS      Objective:    BP (!) 169/72 (BP Location: Left Arm, Patient Position: Sitting, Cuff Size: Normal)   Pulse 72   Temp 97.7 F (36.5 C) (Oral)   Ht 5' 3 (1.6 m)   Wt 171 lb 4.8 oz (77.7 kg)   SpO2 99%   BMI 30.34 kg/m    Physical Exam Vitals reviewed.  Constitutional:      General: She is not in acute distress.    Appearance: Normal appearance. She is not ill-appearing or diaphoretic.  HENT:     Head: Normocephalic.     Right Ear: Tympanic membrane, ear canal and external ear normal.     Left Ear: Tympanic membrane and external ear normal.     Ears:      Comments: Left ear canal present with erythema    Nose: Nose normal.     Mouth/Throat:     Mouth: Mucous membranes are moist.     Pharynx: Oropharynx is clear. No oropharyngeal exudate or posterior oropharyngeal erythema.  Eyes:     General: No scleral icterus.    Conjunctiva/sclera: Conjunctivae normal.     Pupils: Pupils are equal, round, and reactive to light.  Cardiovascular:     Rate and Rhythm: Normal rate and regular rhythm.     Pulses: Normal pulses.     Heart sounds: Normal heart sounds. No murmur heard.    No friction rub. No gallop.  Pulmonary:     Effort: Pulmonary effort is normal. No respiratory distress.     Breath sounds: Normal breath sounds. No wheezing.  Abdominal:     General: There is no distension.     Palpations: Abdomen is soft.     Tenderness: There is no abdominal tenderness. There is no guarding.  Musculoskeletal:     Cervical back: Normal range of motion.     Right lower leg: No edema.     Left lower leg: No edema.  Lymphadenopathy:     Cervical: No cervical adenopathy.  Skin:    General:  Skin is warm and dry.     Capillary Refill: Capillary refill takes less than 2 seconds.  Neurological:     Mental Status: She is alert and oriented to person, place, and time.  Psychiatric:        Mood and Affect: Mood normal.     No results found for any visits on 09/06/23.      Assessment & Plan:   Problem List Items Addressed This Visit   None Visit Diagnoses       Vestibular neuronitis of left ear    -  Primary     Acute otitis externa of left ear, unspecified type          Vestibular neuronitis of left ear Patient presents with a 5 day history of vertigo after having transient sinus-like symptoms including sore throat and cough. She reports that her vertigo was constant and caused decreased hearing. Most likely etiology is vestibular neuronitis. - Start methylprednisolone  taper - Follow up with ENT as scheduled  Acute otitis externa of left  ear, unspecified type Patient presents with left ear canal pain, feeling of fullness, and decreased hearing after experiencing transient sinus-like symptoms. Ear canal is erythematous and tender on examination. Most likely etiology is otitis externa - Start ciprofloxacin -dexamethasone  drops twice daily - Return if symptoms worsen or fail to improve  Meds ordered this encounter  Medications   methylPREDNISolone  (MEDROL  DOSEPAK) 4 MG TBPK tablet    Sig: Follow package directions    Dispense:  21 tablet    Refill:  0   ciprofloxacin -dexamethasone  (CIPRODEX ) OTIC suspension    Sig: Place 4 drops into the left ear 2 (two) times daily for 7 days.    Dispense:  7.5 mL    Refill:  0    Return if symptoms worsen or fail to improve.  Elia LULLA Blanch, Medical Student  Patient seen along with MS3 student, Elia Blanch. I personally evaluated this patient along with the student, and verified all aspects of the history, physical exam, and medical decision making as documented by the student. I agree with the student's documentation and have made all necessary edits.  Marion Seese, Jon HERO, MD, MPH [facility]

## 2023-09-29 DIAGNOSIS — H9202 Otalgia, left ear: Secondary | ICD-10-CM | POA: Diagnosis not present

## 2023-10-11 DIAGNOSIS — Z1212 Encounter for screening for malignant neoplasm of rectum: Secondary | ICD-10-CM | POA: Diagnosis not present

## 2023-10-11 DIAGNOSIS — Z1211 Encounter for screening for malignant neoplasm of colon: Secondary | ICD-10-CM | POA: Diagnosis not present

## 2023-10-18 ENCOUNTER — Ambulatory Visit (INDEPENDENT_AMBULATORY_CARE_PROVIDER_SITE_OTHER)

## 2023-10-18 DIAGNOSIS — Z23 Encounter for immunization: Secondary | ICD-10-CM

## 2023-10-22 LAB — COLOGUARD
COLOGUARD: NEGATIVE
Cologuard: NEGATIVE

## 2023-11-15 ENCOUNTER — Ambulatory Visit

## 2023-11-15 VITALS — BP 141/78 | HR 84 | Temp 98.8°F | Resp 16 | Ht 63.0 in | Wt 172.3 lb

## 2023-11-15 DIAGNOSIS — K120 Recurrent oral aphthae: Secondary | ICD-10-CM | POA: Diagnosis not present

## 2023-11-15 DIAGNOSIS — J0100 Acute maxillary sinusitis, unspecified: Secondary | ICD-10-CM | POA: Diagnosis not present

## 2023-11-15 LAB — POC COVID19/FLU A&B COMBO
Covid Antigen, POC: NEGATIVE
Influenza A Antigen, POC: NEGATIVE
Influenza B Antigen, POC: NEGATIVE

## 2023-11-15 LAB — POCT RAPID STREP A (OFFICE): Rapid Strep A Screen: NEGATIVE

## 2023-11-15 MED ORDER — AMOXICILLIN-POT CLAVULANATE 875-125 MG PO TABS: 1.0000 | ORAL_TABLET | Freq: Two times a day (BID) | ORAL | 0 refills | Status: AC

## 2023-11-15 MED ORDER — LIDOCAINE VISCOUS HCL 2 % MT SOLN: 15.0000 mL | OROMUCOSAL | 0 refills | Status: AC | PRN

## 2023-11-15 NOTE — Progress Notes (Signed)
 Acute visit   Patient: Katie Bass   DOB: Apr 04, 1950   73 y.o. Female  MRN: [id number] PCP: Myrla Jon HERO, MD   Chief Complaint  Patient presents with   Acute Visit    Possible cold/flu ?Since Oct 26 with worsing over time    Subjective    Discussed the use of AI scribe software for clinical note transcription with the patient, who gave verbal consent to proceed.  History of Present Illness Katie Bass is a 73 year old female who presents with symptoms suggestive of a sinus infection and oral ulcerations.  Symptoms began on November 06, 2023, with initial improvement after four to five days, followed by worsening. She experiences severe throat pain, left-sided facial pain, ear discomfort, a dull headache, and swelling under her left ear extending into her neck. She describes her ears as 'squishy' and has a burning sensation in her mouth.  Blisters have developed on the left side of the roof of her mouth, which started burning over the past three days, significantly affecting her appetite. She primarily consumes soup, which she admits may have burned the top of her mouth.  She typically experiences sinus infections once or twice a year, often with seasonal changes, and recalls a similar episode in August 2025. She has a history of ear infections, particularly in the left ear, and has been treated with medications such as Augmentin  in the past.  She recently consulted an ENT specialist in late September or early October 2025, who found no significant issues at that time. She reports difficulty hearing and uses hearing aids.  No fevers, though she has felt hot at times.  Current medications include Mucinex D, Tylenol, and fexofenadine , which have not fully alleviated her condition.    Review of systems as noted in HPI.   Objective    BP (!) 141/78 (BP Location: Left Arm, Patient Position: Sitting)   Pulse 84   Temp 98.8 F (37.1 C) (Oral)   Resp 16   Ht 5' 3  (1.6 m)   Wt 172 lb 4.8 oz (78.2 kg)   SpO2 98%   BMI 30.52 kg/m  Physical Exam Constitutional:      Appearance: Normal appearance.  HENT:     Head: Normocephalic and atraumatic.     Right Ear: Tympanic membrane, ear canal and external ear normal.     Left Ear: Tympanic membrane, ear canal and external ear normal.     Nose: Congestion present.     Right Sinus: No maxillary sinus tenderness or frontal sinus tenderness.     Left Sinus: Maxillary sinus tenderness present. No frontal sinus tenderness.     Mouth/Throat:     Mouth: Mucous membranes are moist. Oral lesions present.     Pharynx: Posterior oropharyngeal erythema present.     Tonsils: No tonsillar exudate.     Comments: 1 shallow ulceration present at roof of mouth Eyes:     Pupils: Pupils are equal, round, and reactive to light.  Cardiovascular:     Rate and Rhythm: Normal rate and regular rhythm.     Heart sounds: Normal heart sounds.  Pulmonary:     Effort: Pulmonary effort is normal. No respiratory distress.     Breath sounds: No wheezing or rhonchi.  Skin:    General: Skin is warm.  Neurological:     General: No focal deficit present.     Mental Status: She is alert.       Results  for orders placed or performed in visit on 11/15/23  POC Covid19/Flu A&B Antigen  Result Value Ref Range   Influenza A Antigen, POC Negative Negative   Influenza B Antigen, POC Negative Negative   Covid Antigen, POC Negative Negative  POCT rapid strep A  Result Value Ref Range   Rapid Strep A Screen Negative Negative    Assessment & Plan     Problem List Items Addressed This Visit   None Visit Diagnoses       Acute non-recurrent maxillary sinusitis    -  Primary   Relevant Medications   amoxicillin -clavulanate (AUGMENTIN ) 875-125 MG tablet   Other Relevant Orders   POC Covid19/Flu A&B Antigen (Completed)   POCT rapid strep A (Completed)     Canker sores oral       Relevant Medications   lidocaine (XYLOCAINE) 2 %  solution      Assessment & Plan Acute left maxillary sinusitis Patient with 5 days of cold symptoms that initially improved but then worsened a few days ago. Concern for acute bacterial sinusitis with L facial, jaw, and ear pain.  Previous ENT evaluation unremarkable. Negative for strep, COVID, flu. - Prescribe Augmentin  for 10 days, twice daily. - Continue Mucinex D, Tylenol as needed - Encourage hydration - Return precautions given  Oral aphthous ulcer (roof of mouth) Aphthous ulcer on palate, likely from irritation. No thrush or exudate present. - Prescribe lidocaine mouthwash for relief. Instruct to swish, spit, gargle if needed, avoid swallowing. - Follow up if no improvement within 1 week   Meds ordered this encounter  Medications   amoxicillin -clavulanate (AUGMENTIN ) 875-125 MG tablet    Sig: Take 1 tablet by mouth 2 (two) times daily for 10 days.    Dispense:  20 tablet    Refill:  0   lidocaine (XYLOCAINE) 2 % solution    Sig: Use as directed 15 mLs in the mouth or throat as needed for mouth pain. Swish and spit.    Dispense:  100 mL    Refill:  0     No follow-ups on file.      Isaiah DELENA Pepper, MD  [facility] [phone] (phone) [phone] (fax)

## 2023-12-01 ENCOUNTER — Ambulatory Visit
Admission: RE | Admit: 2023-12-01 | Discharge: 2023-12-01 | Disposition: A | Discharge status: Home / Self Care | Source: Ambulatory Visit | Attending: Family Medicine | Admitting: Family Medicine

## 2023-12-01 DIAGNOSIS — Z1231 Encounter for screening mammogram for malignant neoplasm of breast: Secondary | ICD-10-CM | POA: Insufficient documentation

## 2023-12-09 ENCOUNTER — Telehealth: Payer: Self-pay

## 2023-12-13 ENCOUNTER — Encounter

## 2023-12-20 NOTE — Telephone Encounter (Signed)
 Requested medication (s) are due for refill today: Yes  Requested medication (s) are on the active medication list: Yes  Last refill:  08/31/22  Future visit scheduled: No  Notes to clinic:  Unable to refill per protocol, last refill by another provider.      Requested Prescriptions  Pending Prescriptions Disp Refills   albuterol  (VENTOLIN  HFA) 108 (90 Base) MCG/ACT inhaler [Pharmacy Med Name: Albuterol  Sulfate HFA 108 (90 Base) MCG/ACT Inhalation Aerosol Solution] 9 g 0    Sig: INHALE 2 PUFFS BY MOUTH EVERY 6 HOURS AS NEEDED FOR WHEEZING AND FOR SHORTNESS OF BREATH     Pulmonology:  Beta Agonists 2 Failed - 12/20/2023  2:25 PM      Failed - Last BP in normal range    BP Readings from Last 1 Encounters:  11/15/23 (!) 141/78         Passed - Last Heart Rate in normal range    Pulse Readings from Last 1 Encounters:  11/15/23 84         Passed - Valid encounter within last 12 months    Recent Outpatient Visits           1 month ago Acute non-recurrent maxillary sinusitis   [facility] [place] Isaiah LABOR, MD   3 months ago Vestibular neuronitis of left ear   [place] [facility] [place], Jon HERO, MD       Future Appointments             In 2 months Hester Alm BROCKS, MD [facility]

## 2024-01-11 ENCOUNTER — Other Ambulatory Visit: Payer: Self-pay | Admitting: Family Medicine

## 2024-01-11 DIAGNOSIS — J45909 Unspecified asthma, uncomplicated: Secondary | ICD-10-CM

## 2024-02-29 ENCOUNTER — Ambulatory Visit: Payer: Medicare HMO | Admitting: Dermatology

## 2024-09-04 ENCOUNTER — Ambulatory Visit
